# Patient Record
Sex: Male | Born: 1995 | Race: White | Hispanic: No | Marital: Single | Smoking: Never smoker
Health system: Southern US, Community
[De-identification: ages and names within clinical notes are randomized; demographics above are authoritative.]

---

## 2014-12-22 ENCOUNTER — Inpatient Hospital Stay (HOSPITAL_COMMUNITY): Payer: BLUE CROSS/BLUE SHIELD

## 2014-12-22 ENCOUNTER — Emergency Department (HOSPITAL_COMMUNITY): Payer: BLUE CROSS/BLUE SHIELD | Admitting: Anesthesiology

## 2014-12-22 ENCOUNTER — Emergency Department (HOSPITAL_COMMUNITY): Payer: BLUE CROSS/BLUE SHIELD

## 2014-12-22 ENCOUNTER — Inpatient Hospital Stay (HOSPITAL_COMMUNITY)
Admission: EM | Admit: 2014-12-22 | Discharge: 2014-12-31 | DRG: 956 | Disposition: A | Discharge status: Rehabilitation Facility | Payer: BLUE CROSS/BLUE SHIELD | Attending: General Surgery | Admitting: General Surgery

## 2014-12-22 ENCOUNTER — Encounter (HOSPITAL_COMMUNITY): Admission: EM | Disposition: A | Discharge status: Rehabilitation Facility | Payer: Self-pay | Source: Home / Self Care

## 2014-12-22 DIAGNOSIS — R402112 Coma scale, eyes open, never, at arrival to emergency department: Secondary | ICD-10-CM | POA: Diagnosis present

## 2014-12-22 DIAGNOSIS — S32049A Unspecified fracture of fourth lumbar vertebra, initial encounter for closed fracture: Secondary | ICD-10-CM | POA: Diagnosis present

## 2014-12-22 DIAGNOSIS — K567 Ileus, unspecified: Secondary | ICD-10-CM | POA: Diagnosis not present

## 2014-12-22 DIAGNOSIS — F09 Unspecified mental disorder due to known physiological condition: Secondary | ICD-10-CM | POA: Diagnosis not present

## 2014-12-22 DIAGNOSIS — S6292XA Unspecified fracture of left wrist and hand, initial encounter for closed fracture: Secondary | ICD-10-CM | POA: Diagnosis present

## 2014-12-22 DIAGNOSIS — R310 Gross hematuria: Secondary | ICD-10-CM | POA: Diagnosis not present

## 2014-12-22 DIAGNOSIS — S7291XA Unspecified fracture of right femur, initial encounter for closed fracture: Secondary | ICD-10-CM | POA: Diagnosis present

## 2014-12-22 DIAGNOSIS — R402362 Coma scale, best motor response, obeys commands, at arrival to emergency department: Secondary | ICD-10-CM | POA: Diagnosis present

## 2014-12-22 DIAGNOSIS — S32039A Unspecified fracture of third lumbar vertebra, initial encounter for closed fracture: Secondary | ICD-10-CM | POA: Diagnosis present

## 2014-12-22 DIAGNOSIS — S27322A Contusion of lung, bilateral, initial encounter: Secondary | ICD-10-CM | POA: Diagnosis present

## 2014-12-22 DIAGNOSIS — S72351A Displaced comminuted fracture of shaft of right femur, initial encounter for closed fracture: Principal | ICD-10-CM | POA: Diagnosis present

## 2014-12-22 DIAGNOSIS — S069X4S Unspecified intracranial injury with loss of consciousness of 6 hours to 24 hours, sequela: Secondary | ICD-10-CM | POA: Diagnosis not present

## 2014-12-22 DIAGNOSIS — D696 Thrombocytopenia, unspecified: Secondary | ICD-10-CM | POA: Diagnosis not present

## 2014-12-22 DIAGNOSIS — E871 Hypo-osmolality and hyponatremia: Secondary | ICD-10-CM | POA: Diagnosis not present

## 2014-12-22 DIAGNOSIS — S37091S Other injury of right kidney, sequela: Secondary | ICD-10-CM | POA: Diagnosis not present

## 2014-12-22 DIAGNOSIS — I959 Hypotension, unspecified: Secondary | ICD-10-CM | POA: Diagnosis present

## 2014-12-22 DIAGNOSIS — S37009A Unspecified injury of unspecified kidney, initial encounter: Secondary | ICD-10-CM | POA: Diagnosis present

## 2014-12-22 DIAGNOSIS — T1490XA Injury, unspecified, initial encounter: Secondary | ICD-10-CM

## 2014-12-22 DIAGNOSIS — D62 Acute posthemorrhagic anemia: Secondary | ICD-10-CM | POA: Diagnosis not present

## 2014-12-22 DIAGNOSIS — S37001S Unspecified injury of right kidney, sequela: Secondary | ICD-10-CM | POA: Diagnosis not present

## 2014-12-22 DIAGNOSIS — S2239XA Fracture of one rib, unspecified side, initial encounter for closed fracture: Secondary | ICD-10-CM | POA: Diagnosis present

## 2014-12-22 DIAGNOSIS — Y92410 Unspecified street and highway as the place of occurrence of the external cause: Secondary | ICD-10-CM

## 2014-12-22 DIAGNOSIS — Z419 Encounter for procedure for purposes other than remedying health state, unspecified: Secondary | ICD-10-CM

## 2014-12-22 DIAGNOSIS — S62309A Unspecified fracture of unspecified metacarpal bone, initial encounter for closed fracture: Secondary | ICD-10-CM

## 2014-12-22 DIAGNOSIS — S6292XS Unspecified fracture of left wrist and hand, sequela: Secondary | ICD-10-CM | POA: Diagnosis not present

## 2014-12-22 DIAGNOSIS — S32019A Unspecified fracture of first lumbar vertebra, initial encounter for closed fracture: Secondary | ICD-10-CM | POA: Diagnosis present

## 2014-12-22 DIAGNOSIS — S069X1S Unspecified intracranial injury with loss of consciousness of 30 minutes or less, sequela: Secondary | ICD-10-CM | POA: Diagnosis not present

## 2014-12-22 DIAGNOSIS — S62391A Other fracture of second metacarpal bone, left hand, initial encounter for closed fracture: Secondary | ICD-10-CM | POA: Diagnosis present

## 2014-12-22 DIAGNOSIS — S35411A Laceration of right renal artery, initial encounter: Secondary | ICD-10-CM

## 2014-12-22 DIAGNOSIS — S62393A Other fracture of third metacarpal bone, left hand, initial encounter for closed fracture: Secondary | ICD-10-CM | POA: Diagnosis present

## 2014-12-22 DIAGNOSIS — M79661 Pain in right lower leg: Secondary | ICD-10-CM | POA: Diagnosis present

## 2014-12-22 DIAGNOSIS — S37031A Laceration of right kidney, unspecified degree, initial encounter: Secondary | ICD-10-CM | POA: Diagnosis present

## 2014-12-22 DIAGNOSIS — S42101S Fracture of unspecified part of scapula, right shoulder, sequela: Secondary | ICD-10-CM | POA: Diagnosis not present

## 2014-12-22 DIAGNOSIS — S36113A Laceration of liver, unspecified degree, initial encounter: Secondary | ICD-10-CM | POA: Diagnosis present

## 2014-12-22 DIAGNOSIS — R413 Other amnesia: Secondary | ICD-10-CM | POA: Diagnosis present

## 2014-12-22 DIAGNOSIS — R0902 Hypoxemia: Secondary | ICD-10-CM | POA: Diagnosis not present

## 2014-12-22 DIAGNOSIS — S37031D Laceration of right kidney, unspecified degree, subsequent encounter: Secondary | ICD-10-CM

## 2014-12-22 DIAGNOSIS — S27329A Contusion of lung, unspecified, initial encounter: Secondary | ICD-10-CM

## 2014-12-22 DIAGNOSIS — H547 Unspecified visual loss: Secondary | ICD-10-CM

## 2014-12-22 DIAGNOSIS — R402242 Coma scale, best verbal response, confused conversation, at arrival to emergency department: Secondary | ICD-10-CM | POA: Diagnosis present

## 2014-12-22 DIAGNOSIS — S37009S Unspecified injury of unspecified kidney, sequela: Secondary | ICD-10-CM

## 2014-12-22 DIAGNOSIS — S81012A Laceration without foreign body, left knee, initial encounter: Secondary | ICD-10-CM | POA: Diagnosis present

## 2014-12-22 DIAGNOSIS — S37001A Unspecified injury of right kidney, initial encounter: Secondary | ICD-10-CM | POA: Diagnosis present

## 2014-12-22 DIAGNOSIS — S060X0A Concussion without loss of consciousness, initial encounter: Secondary | ICD-10-CM | POA: Diagnosis present

## 2014-12-22 DIAGNOSIS — S37031S Laceration of right kidney, unspecified degree, sequela: Secondary | ICD-10-CM

## 2014-12-22 DIAGNOSIS — S069X3S Unspecified intracranial injury with loss of consciousness of 1 hour to 5 hours 59 minutes, sequela: Secondary | ICD-10-CM | POA: Diagnosis not present

## 2014-12-22 DIAGNOSIS — S42101A Fracture of unspecified part of scapula, right shoulder, initial encounter for closed fracture: Secondary | ICD-10-CM | POA: Diagnosis present

## 2014-12-22 DIAGNOSIS — S32029A Unspecified fracture of second lumbar vertebra, initial encounter for closed fracture: Secondary | ICD-10-CM | POA: Diagnosis present

## 2014-12-22 DIAGNOSIS — S069X1D Unspecified intracranial injury with loss of consciousness of 30 minutes or less, subsequent encounter: Secondary | ICD-10-CM | POA: Diagnosis not present

## 2014-12-22 DIAGNOSIS — S3690XS Unspecified injury of unspecified intra-abdominal organ, sequela: Secondary | ICD-10-CM | POA: Diagnosis not present

## 2014-12-22 DIAGNOSIS — S42111A Displaced fracture of body of scapula, right shoulder, initial encounter for closed fracture: Secondary | ICD-10-CM | POA: Diagnosis present

## 2014-12-22 DIAGNOSIS — S06893S Other specified intracranial injury with loss of consciousness of 1 hour to 5 hours 59 minutes, sequela: Secondary | ICD-10-CM | POA: Diagnosis not present

## 2014-12-22 DIAGNOSIS — S069X9S Unspecified intracranial injury with loss of consciousness of unspecified duration, sequela: Secondary | ICD-10-CM | POA: Diagnosis not present

## 2014-12-22 DIAGNOSIS — S7291XS Unspecified fracture of right femur, sequela: Secondary | ICD-10-CM | POA: Diagnosis not present

## 2014-12-22 HISTORY — PX: FEMUR IM NAIL: SHX1597

## 2014-12-22 HISTORY — PX: I & D EXTREMITY: SHX5045

## 2014-12-22 LAB — POCT I-STAT 4, (NA,K, GLUC, HGB,HCT)
GLUCOSE: 225 mg/dL — AB (ref 65–99)
Glucose, Bld: 249 mg/dL — ABNORMAL HIGH (ref 65–99)
HCT: 27 % — ABNORMAL LOW (ref 39.0–52.0)
HEMATOCRIT: 30 % — AB (ref 39.0–52.0)
Hemoglobin: 10.2 g/dL — ABNORMAL LOW (ref 13.0–17.0)
Hemoglobin: 9.2 g/dL — ABNORMAL LOW (ref 13.0–17.0)
Potassium: 5.4 mmol/L — ABNORMAL HIGH (ref 3.5–5.1)
Potassium: 5.1 mmol/L (ref 3.5–5.1)
Sodium: 137 mmol/L (ref 135–145)
Sodium: 138 mmol/L (ref 135–145)

## 2014-12-22 LAB — SAMPLE TO BLOOD BANK

## 2014-12-22 LAB — CBC
HCT: 45 % (ref 39.0–52.0)
HCT: 28.9 % — ABNORMAL LOW (ref 39.0–52.0)
HEMOGLOBIN: 10 g/dL — AB (ref 13.0–17.0)
Hemoglobin: 15.1 g/dL (ref 13.0–17.0)
MCH: 31.5 pg (ref 26.0–34.0)
MCH: 31.3 pg (ref 26.0–34.0)
MCHC: 33.6 g/dL (ref 30.0–36.0)
MCHC: 34.6 g/dL (ref 30.0–36.0)
MCV: 93.9 fL (ref 78.0–100.0)
MCV: 90.3 fL (ref 78.0–100.0)
PLATELETS: 265 10*3/uL (ref 150–400)
PLATELETS: 104 10*3/uL — AB (ref 150–400)
RBC: 4.79 MIL/uL (ref 4.22–5.81)
RBC: 3.2 MIL/uL — AB (ref 4.22–5.81)
RDW: 12.9 % (ref 11.5–15.5)
RDW: 13.7 % (ref 11.5–15.5)
WBC: 7.4 10*3/uL (ref 4.0–10.5)
WBC: 9.2 10*3/uL (ref 4.0–10.5)

## 2014-12-22 LAB — CBC WITH DIFFERENTIAL/PLATELET
Basophils Absolute: 0 10*3/uL (ref 0.0–0.1)
Basophils Relative: 0 % (ref 0–1)
EOS ABS: 0 10*3/uL (ref 0.0–0.7)
EOS PCT: 0 % (ref 0–5)
HCT: 36.7 % — ABNORMAL LOW (ref 39.0–52.0)
HEMOGLOBIN: 12.3 g/dL — AB (ref 13.0–17.0)
LYMPHS ABS: 1.6 10*3/uL (ref 0.7–4.0)
LYMPHS PCT: 9 % — AB (ref 12–46)
MCH: 31.7 pg (ref 26.0–34.0)
MCHC: 33.5 g/dL (ref 30.0–36.0)
MCV: 94.6 fL (ref 78.0–100.0)
MONOS PCT: 11 % (ref 3–12)
Monocytes Absolute: 2 10*3/uL — ABNORMAL HIGH (ref 0.1–1.0)
Neutro Abs: 14.4 10*3/uL — ABNORMAL HIGH (ref 1.7–7.7)
Neutrophils Relative %: 80 % — ABNORMAL HIGH (ref 43–77)
PLATELETS: 210 10*3/uL (ref 150–400)
RBC: 3.88 MIL/uL — ABNORMAL LOW (ref 4.22–5.81)
RDW: 12.9 % (ref 11.5–15.5)
WBC: 18 10*3/uL — ABNORMAL HIGH (ref 4.0–10.5)

## 2014-12-22 LAB — POCT I-STAT 7, (LYTES, BLD GAS, ICA,H+H)
ACID-BASE DEFICIT: 5 mmol/L — AB (ref 0.0–2.0)
Acid-base deficit: 2 mmol/L (ref 0.0–2.0)
BICARBONATE: 23.6 meq/L (ref 20.0–24.0)
BICARBONATE: 21.3 meq/L (ref 20.0–24.0)
CALCIUM ION: 0.88 mmol/L — AB (ref 1.12–1.23)
Calcium, Ion: 1.1 mmol/L — ABNORMAL LOW (ref 1.12–1.23)
HEMATOCRIT: 25 % — AB (ref 39.0–52.0)
HEMATOCRIT: 30 % — AB (ref 39.0–52.0)
HEMOGLOBIN: 8.5 g/dL — AB (ref 13.0–17.0)
Hemoglobin: 10.2 g/dL — ABNORMAL LOW (ref 13.0–17.0)
O2 SAT: 100 %
O2 Saturation: 100 %
PCO2 ART: 43 mmHg (ref 35.0–45.0)
PH ART: 7.349 — AB (ref 7.350–7.450)
PH ART: 7.308 — AB (ref 7.350–7.450)
POTASSIUM: 4.5 mmol/L (ref 3.5–5.1)
POTASSIUM: 5.5 mmol/L — AB (ref 3.5–5.1)
Patient temperature: 37.2
Patient temperature: 36.4
SODIUM: 138 mmol/L (ref 135–145)
SODIUM: 137 mmol/L (ref 135–145)
TCO2: 25 mmol/L (ref 0–100)
TCO2: 23 mmol/L (ref 0–100)
pCO2 arterial: 42.2 mmHg (ref 35.0–45.0)
pO2, Arterial: 329 mmHg — ABNORMAL HIGH (ref 80.0–100.0)
pO2, Arterial: 272 mmHg — ABNORMAL HIGH (ref 80.0–100.0)

## 2014-12-22 LAB — COMPREHENSIVE METABOLIC PANEL
ALK PHOS: 71 U/L (ref 38–126)
ALT: 163 U/L — AB (ref 17–63)
AST: 201 U/L — ABNORMAL HIGH (ref 15–41)
Albumin: 4.1 g/dL (ref 3.5–5.0)
Anion gap: 12 (ref 5–15)
BUN: 9 mg/dL (ref 6–20)
CALCIUM: 8.8 mg/dL — AB (ref 8.9–10.3)
CO2: 24 mmol/L (ref 22–32)
CREATININE: 1.23 mg/dL (ref 0.61–1.24)
Chloride: 103 mmol/L (ref 101–111)
Glucose, Bld: 130 mg/dL — ABNORMAL HIGH (ref 65–99)
Potassium: 3.2 mmol/L — ABNORMAL LOW (ref 3.5–5.1)
Sodium: 139 mmol/L (ref 135–145)
Total Bilirubin: 1.3 mg/dL — ABNORMAL HIGH (ref 0.3–1.2)
Total Protein: 7 g/dL (ref 6.5–8.1)

## 2014-12-22 LAB — BLOOD GAS, ARTERIAL
ACID-BASE DEFICIT: 5 mmol/L — AB (ref 0.0–2.0)
Bicarbonate: 19.3 mEq/L — ABNORMAL LOW (ref 20.0–24.0)
DRAWN BY: 270271
FIO2: 0.7
O2 SAT: 99.8 %
PCO2 ART: 34 mmHg — AB (ref 35.0–45.0)
PEEP: 5 cmH2O
PH ART: 7.373 (ref 7.350–7.450)
Patient temperature: 98.6
RATE: 18 resp/min
TCO2: 20.3 mmol/L (ref 0–100)
VT: 600 mL
pO2, Arterial: 315 mmHg — ABNORMAL HIGH (ref 80.0–100.0)

## 2014-12-22 LAB — ETHANOL

## 2014-12-22 LAB — ABO/RH: ABO/RH(D): A POS

## 2014-12-22 LAB — PROTIME-INR
INR: 1.23 (ref 0.00–1.49)
Prothrombin Time: 15.6 seconds — ABNORMAL HIGH (ref 11.6–15.2)

## 2014-12-22 LAB — MRSA PCR SCREENING: MRSA BY PCR: NEGATIVE

## 2014-12-22 LAB — CDS SEROLOGY

## 2014-12-22 LAB — PREPARE RBC (CROSSMATCH)

## 2014-12-22 SURGERY — INSERTION, INTRAMEDULLARY ROD, FEMUR
Anesthesia: General | Site: Leg Upper | Laterality: Right

## 2014-12-22 MED ORDER — TETANUS-DIPHTH-ACELL PERTUSSIS 5-2.5-18.5 LF-MCG/0.5 IM SUSP
0.5000 mL | Freq: Once | INTRAMUSCULAR | Status: AC
  Administered 2014-12-22: 0.5 mL via INTRAMUSCULAR
  Filled 2014-12-22: qty 0.5

## 2014-12-22 MED ORDER — METOCLOPRAMIDE HCL 5 MG/ML IJ SOLN: 5.0000 mg | Freq: Three times a day (TID) | INTRAMUSCULAR | Status: DC | PRN

## 2014-12-22 MED ORDER — PHENYLEPHRINE HCL 10 MG/ML IJ SOLN
10.0000 mg | INTRAVENOUS | Status: DC | PRN
  Administered 2014-12-22: 20 ug/min via INTRAVENOUS

## 2014-12-22 MED ORDER — LACTATED RINGERS IV SOLN
INTRAVENOUS | Status: DC | PRN
  Administered 2014-12-22 (×2): via INTRAVENOUS

## 2014-12-22 MED ORDER — ALBUMIN HUMAN 5 % IV SOLN: INTRAVENOUS | Status: DC | PRN

## 2014-12-22 MED ORDER — ALBUMIN HUMAN 5 % IV SOLN
INTRAVENOUS | Status: DC | PRN
  Administered 2014-12-22 (×2): via INTRAVENOUS

## 2014-12-22 MED ORDER — ONDANSETRON HCL 4 MG PO TABS: 4.0000 mg | ORAL_TABLET | Freq: Four times a day (QID) | ORAL | Status: DC | PRN

## 2014-12-22 MED ORDER — FENTANYL CITRATE (PF) 100 MCG/2ML IJ SOLN
INTRAMUSCULAR | Status: AC
  Filled 2014-12-22: qty 2

## 2014-12-22 MED ORDER — HYDROMORPHONE HCL 1 MG/ML IJ SOLN
0.5000 mg | INTRAMUSCULAR | Status: DC | PRN
  Administered 2014-12-23 – 2014-12-24 (×5): 0.5 mg via INTRAVENOUS
  Filled 2014-12-22 (×5): qty 1

## 2014-12-22 MED ORDER — SODIUM CHLORIDE 0.9 % IV SOLN
INTRAVENOUS | Status: DC | PRN
  Administered 2014-12-22 (×2): via INTRAVENOUS

## 2014-12-22 MED ORDER — ROCURONIUM BROMIDE 50 MG/5ML IV SOLN
INTRAVENOUS | Status: AC
  Filled 2014-12-22: qty 1

## 2014-12-22 MED ORDER — DOCUSATE SODIUM 100 MG PO CAPS
100.0000 mg | ORAL_CAPSULE | Freq: Two times a day (BID) | ORAL | Status: DC
  Administered 2014-12-24 – 2014-12-30 (×13): 100 mg via ORAL
  Filled 2014-12-22 (×18): qty 1

## 2014-12-22 MED ORDER — FENTANYL CITRATE (PF) 250 MCG/5ML IJ SOLN
INTRAMUSCULAR | Status: AC
  Filled 2014-12-22: qty 5

## 2014-12-22 MED ORDER — FENTANYL CITRATE (PF) 100 MCG/2ML IJ SOLN
INTRAMUSCULAR | Status: AC | PRN
  Administered 2014-12-22: 100 ug via INTRAVENOUS

## 2014-12-22 MED ORDER — DEXMEDETOMIDINE HCL IN NACL 200 MCG/50ML IV SOLN
0.4000 ug/kg/h | INTRAVENOUS | Status: DC
  Administered 2014-12-22 – 2014-12-23 (×2): 0.5 ug/kg/h via INTRAVENOUS
  Filled 2014-12-22: qty 50
  Filled 2014-12-22: qty 100

## 2014-12-22 MED ORDER — SUCCINYLCHOLINE CHLORIDE 20 MG/ML IJ SOLN
INTRAMUSCULAR | Status: DC | PRN
  Administered 2014-12-22: 100 mg via INTRAVENOUS

## 2014-12-22 MED ORDER — POTASSIUM CHLORIDE IN NACL 20-0.45 MEQ/L-% IV SOLN
INTRAVENOUS | Status: DC
  Administered 2014-12-22 – 2014-12-25 (×4): via INTRAVENOUS
  Administered 2014-12-25: 1000 mL via INTRAVENOUS
  Administered 2014-12-26: 18:00:00 via INTRAVENOUS
  Administered 2014-12-26: 1000 mL via INTRAVENOUS
  Administered 2014-12-27 – 2014-12-28 (×2): via INTRAVENOUS
  Filled 2014-12-22 (×12): qty 1000

## 2014-12-22 MED ORDER — PHENOL 1.4 % MT LIQD: 1.0000 | OROMUCOSAL | Status: DC | PRN

## 2014-12-22 MED ORDER — SODIUM CHLORIDE 0.9 % IV SOLN
INTRAVENOUS | Status: DC | PRN
  Administered 2014-12-22 (×2): via INTRAVENOUS

## 2014-12-22 MED ORDER — PANTOPRAZOLE SODIUM 40 MG IV SOLR
40.0000 mg | Freq: Every day | INTRAVENOUS | Status: DC
  Administered 2014-12-23 (×2): 40 mg via INTRAVENOUS
  Filled 2014-12-22 (×8): qty 40

## 2014-12-22 MED ORDER — PHENYLEPHRINE HCL 10 MG/ML IJ SOLN
INTRAMUSCULAR | Status: DC | PRN
  Administered 2014-12-22: 40 ug via INTRAVENOUS

## 2014-12-22 MED ORDER — SODIUM CHLORIDE 0.9 % IV SOLN
10.0000 mL/h | Freq: Once | INTRAVENOUS | Status: AC
Start: 2014-12-22 — End: 2014-12-22
  Administered 2014-12-22 (×2): via INTRAVENOUS

## 2014-12-22 MED ORDER — PROPOFOL 10 MG/ML IV BOLUS
INTRAVENOUS | Status: AC
  Filled 2014-12-22: qty 20

## 2014-12-22 MED ORDER — CEFAZOLIN SODIUM-DEXTROSE 2-3 GM-% IV SOLR
2.0000 g | Freq: Four times a day (QID) | INTRAVENOUS | Status: AC
  Administered 2014-12-22 – 2014-12-23 (×2): 2 g via INTRAVENOUS
  Filled 2014-12-22 (×2): qty 50

## 2014-12-22 MED ORDER — PANTOPRAZOLE SODIUM 40 MG PO TBEC
40.0000 mg | DELAYED_RELEASE_TABLET | Freq: Every day | ORAL | Status: DC
  Administered 2014-12-24 – 2014-12-28 (×5): 40 mg via ORAL
  Filled 2014-12-22 (×5): qty 1

## 2014-12-22 MED ORDER — METOCLOPRAMIDE HCL 5 MG PO TABS
5.0000 mg | ORAL_TABLET | Freq: Three times a day (TID) | ORAL | Status: DC | PRN
  Filled 2014-12-22: qty 2

## 2014-12-22 MED ORDER — MIDAZOLAM HCL 5 MG/5ML IJ SOLN
INTRAMUSCULAR | Status: DC | PRN
Start: 2014-12-22 — End: 2014-12-22
  Administered 2014-12-22 (×4): 1 mg via INTRAVENOUS

## 2014-12-22 MED ORDER — FENTANYL CITRATE (PF) 100 MCG/2ML IJ SOLN
25.0000 ug | INTRAMUSCULAR | Status: DC | PRN
  Administered 2014-12-22: 100 ug via INTRAVENOUS
  Administered 2014-12-23 (×2): 50 ug via INTRAVENOUS
  Administered 2014-12-23 (×2): 25 ug via INTRAVENOUS
  Administered 2014-12-23 – 2014-12-24 (×3): 75 ug via INTRAVENOUS
  Filled 2014-12-22 (×8): qty 2

## 2014-12-22 MED ORDER — SODIUM CHLORIDE 0.9 % IR SOLN
Status: DC | PRN
  Administered 2014-12-22: 1

## 2014-12-22 MED ORDER — ACETAMINOPHEN 650 MG RE SUPP: 650.0000 mg | Freq: Four times a day (QID) | RECTAL | Status: DC | PRN

## 2014-12-22 MED ORDER — ONDANSETRON HCL 4 MG/2ML IJ SOLN: 4.0000 mg | Freq: Four times a day (QID) | INTRAMUSCULAR | Status: DC | PRN

## 2014-12-22 MED ORDER — FENTANYL CITRATE (PF) 100 MCG/2ML IJ SOLN
50.0000 ug | Freq: Once | INTRAMUSCULAR | Status: AC
  Administered 2014-12-22: 50 ug via INTRAVENOUS
  Filled 2014-12-22: qty 2

## 2014-12-22 MED ORDER — FENTANYL CITRATE (PF) 100 MCG/2ML IJ SOLN
INTRAMUSCULAR | Status: DC | PRN
  Administered 2014-12-22 (×2): 100 ug via INTRAVENOUS
  Administered 2014-12-22 (×6): 50 ug via INTRAVENOUS

## 2014-12-22 MED ORDER — IOHEXOL 300 MG/ML  SOLN
100.0000 mL | Freq: Once | INTRAMUSCULAR | Status: AC | PRN
  Administered 2014-12-22: 100 mL via INTRAVENOUS

## 2014-12-22 MED ORDER — MIDAZOLAM HCL 2 MG/2ML IJ SOLN
INTRAMUSCULAR | Status: AC
  Filled 2014-12-22: qty 4

## 2014-12-22 MED ORDER — SILVER SULFADIAZINE 1 % EX CREA
TOPICAL_CREAM | Freq: Every day | CUTANEOUS | Status: DC
  Administered 2014-12-24 – 2014-12-30 (×7): via TOPICAL
  Administered 2014-12-31: 1 via TOPICAL
  Filled 2014-12-22 (×2): qty 85

## 2014-12-22 MED ORDER — OXYCODONE HCL 5 MG PO TABS
5.0000 mg | ORAL_TABLET | ORAL | Status: DC | PRN
  Administered 2014-12-24: 10 mg via ORAL
  Administered 2014-12-25 – 2014-12-26 (×6): 15 mg via ORAL
  Administered 2014-12-26 (×2): 10 mg via ORAL
  Administered 2014-12-26 – 2014-12-28 (×4): 15 mg via ORAL
  Administered 2014-12-28: 10 mg via ORAL
  Administered 2014-12-28 – 2014-12-29 (×3): 15 mg via ORAL
  Filled 2014-12-22 (×3): qty 3
  Filled 2014-12-22 (×2): qty 2
  Filled 2014-12-22 (×2): qty 3
  Filled 2014-12-22 (×2): qty 2
  Filled 2014-12-22 (×2): qty 3
  Filled 2014-12-22: qty 1
  Filled 2014-12-22: qty 2
  Filled 2014-12-22 (×6): qty 3

## 2014-12-22 MED ORDER — LIDOCAINE HCL (CARDIAC) 20 MG/ML IV SOLN
INTRAVENOUS | Status: DC | PRN
  Administered 2014-12-22: 60 mg via INTRAVENOUS

## 2014-12-22 MED ORDER — DEXMEDETOMIDINE HCL IN NACL 200 MCG/50ML IV SOLN
INTRAVENOUS | Status: DC | PRN
  Administered 2014-12-22: .5 ug/kg/h via INTRAVENOUS
  Administered 2014-12-22: 0.4 ug/kg/h via INTRAVENOUS

## 2014-12-22 MED ORDER — CEFAZOLIN SODIUM-DEXTROSE 2-3 GM-% IV SOLR
INTRAVENOUS | Status: AC
  Filled 2014-12-22: qty 50

## 2014-12-22 MED ORDER — LIDOCAINE HCL (CARDIAC) 20 MG/ML IV SOLN
INTRAVENOUS | Status: AC
  Filled 2014-12-22: qty 5

## 2014-12-22 MED ORDER — ACETAMINOPHEN 325 MG PO TABS: 650.0000 mg | ORAL_TABLET | Freq: Four times a day (QID) | ORAL | Status: DC | PRN

## 2014-12-22 MED ORDER — CEFAZOLIN SODIUM-DEXTROSE 2-3 GM-% IV SOLR
INTRAVENOUS | Status: DC | PRN
  Administered 2014-12-22: 2 g via INTRAVENOUS

## 2014-12-22 MED ORDER — SODIUM CHLORIDE 0.9 % IV BOLUS (SEPSIS)
500.0000 mL | Freq: Once | INTRAVENOUS | Status: AC
  Administered 2014-12-22: 500 mL via INTRAVENOUS

## 2014-12-22 MED ORDER — MENTHOL 3 MG MT LOZG: 1.0000 | LOZENGE | OROMUCOSAL | Status: DC | PRN

## 2014-12-22 MED ORDER — MORPHINE SULFATE (PF) 2 MG/ML IV SOLN: 0.5000 mg | INTRAVENOUS | Status: DC | PRN

## 2014-12-22 MED ORDER — PROPOFOL 10 MG/ML IV BOLUS
INTRAVENOUS | Status: DC | PRN
  Administered 2014-12-22: 150 mg via INTRAVENOUS
  Administered 2014-12-22: 10 mg via INTRAVENOUS

## 2014-12-22 MED ORDER — ONDANSETRON HCL 4 MG/2ML IJ SOLN
4.0000 mg | Freq: Once | INTRAMUSCULAR | Status: AC
  Administered 2014-12-22: 4 mg via INTRAVENOUS
  Filled 2014-12-22: qty 2

## 2014-12-22 MED ORDER — SODIUM CHLORIDE 0.9 % IV BOLUS (SEPSIS)
1000.0000 mL | Freq: Once | INTRAVENOUS | Status: AC
  Administered 2014-12-22: 1000 mL via INTRAVENOUS

## 2014-12-22 MED ORDER — ROCURONIUM BROMIDE 100 MG/10ML IV SOLN
INTRAVENOUS | Status: DC | PRN
  Administered 2014-12-22: 30 mg via INTRAVENOUS
  Administered 2014-12-22: 20 mg via INTRAVENOUS

## 2014-12-22 MED ORDER — 0.9 % SODIUM CHLORIDE (POUR BTL) OPTIME
TOPICAL | Status: DC | PRN
  Administered 2014-12-22: 1000 mL

## 2014-12-22 SURGICAL SUPPLY — 82 items
BAG DECANTER FOR FLEXI CONT (MISCELLANEOUS) ×4 IMPLANT
BANDAGE ELASTIC 4 VELCRO ST LF (GAUZE/BANDAGES/DRESSINGS) ×4 IMPLANT
BANDAGE ELASTIC 6 VELCRO ST LF (GAUZE/BANDAGES/DRESSINGS) ×4 IMPLANT
BIT DRILL 3.8X6 NS (BIT) ×4 IMPLANT
BIT DRILL 5.3 (BIT) ×4 IMPLANT
BNDG COHESIVE 4X5 TAN STRL (GAUZE/BANDAGES/DRESSINGS) ×4 IMPLANT
BNDG GAUZE ELAST 4 BULKY (GAUZE/BANDAGES/DRESSINGS) ×4 IMPLANT
BRUSH SCRUB DISP (MISCELLANEOUS) ×4 IMPLANT
COVER MAYO STAND STRL (DRAPES) ×8 IMPLANT
COVER PERINEAL POST (MISCELLANEOUS) ×4 IMPLANT
COVER SURGICAL LIGHT HANDLE (MISCELLANEOUS) ×8 IMPLANT
CUFF TOURNIQUET SINGLE 18IN (TOURNIQUET CUFF) IMPLANT
CUFF TOURNIQUET SINGLE 24IN (TOURNIQUET CUFF) IMPLANT
CUFF TOURNIQUET SINGLE 34IN LL (TOURNIQUET CUFF) IMPLANT
DRAPE C-ARM 42X72 X-RAY (DRAPES) IMPLANT
DRAPE IMP U-DRAPE 54X76 (DRAPES) ×4 IMPLANT
DRAPE INCISE IOBAN 66X45 STRL (DRAPES) ×4 IMPLANT
DRAPE ORTHO SPLIT 77X108 STRL (DRAPES)
DRAPE PROXIMA HALF (DRAPES) ×8 IMPLANT
DRAPE STERI IOBAN 125X83 (DRAPES) ×4 IMPLANT
DRAPE SURG ORHT 6 SPLT 77X108 (DRAPES) IMPLANT
DRAPE U-SHAPE 47X51 STRL (DRAPES) ×4 IMPLANT
DRSG ADAPTIC 3X8 NADH LF (GAUZE/BANDAGES/DRESSINGS) IMPLANT
DRSG EMULSION OIL 3X3 NADH (GAUZE/BANDAGES/DRESSINGS) ×4 IMPLANT
DRSG MEPILEX BORDER 4X4 (GAUZE/BANDAGES/DRESSINGS) ×8 IMPLANT
DRSG MEPILEX BORDER 4X8 (GAUZE/BANDAGES/DRESSINGS) ×4 IMPLANT
DRSG OPSITE POSTOP 4X10 (GAUZE/BANDAGES/DRESSINGS) ×12 IMPLANT
DRSG OPSITE POSTOP 4X6 (GAUZE/BANDAGES/DRESSINGS) ×4 IMPLANT
DRSG PAD ABDOMINAL 8X10 ST (GAUZE/BANDAGES/DRESSINGS) ×8 IMPLANT
DURAPREP 26ML APPLICATOR (WOUND CARE) ×4 IMPLANT
ELECT REM PT RETURN 9FT ADLT (ELECTROSURGICAL) ×4
ELECTRODE REM PT RTRN 9FT ADLT (ELECTROSURGICAL) ×2 IMPLANT
FACESHIELD WRAPAROUND (MASK) ×4 IMPLANT
GAUZE SPONGE 4X4 12PLY STRL (GAUZE/BANDAGES/DRESSINGS) ×4 IMPLANT
GLOVE BIOGEL PI ORTHO PRO 7.5 (GLOVE) ×2
GLOVE BIOGEL PI ORTHO PRO SZ8 (GLOVE) ×2
GLOVE ORTHO TXT STRL SZ7.5 (GLOVE) ×4 IMPLANT
GLOVE PI ORTHO PRO STRL 7.5 (GLOVE) ×2 IMPLANT
GLOVE PI ORTHO PRO STRL SZ8 (GLOVE) ×2 IMPLANT
GLOVE SURG ORTHO 8.5 STRL (GLOVE) ×4 IMPLANT
GOWN STRL REUS W/ TWL LRG LVL3 (GOWN DISPOSABLE) ×6 IMPLANT
GOWN STRL REUS W/ TWL XL LVL3 (GOWN DISPOSABLE) ×4 IMPLANT
GOWN STRL REUS W/TWL LRG LVL3 (GOWN DISPOSABLE) ×6
GOWN STRL REUS W/TWL XL LVL3 (GOWN DISPOSABLE) ×4
GUIDEWIRE BALL NOSE 100CM (WIRE) ×8 IMPLANT
HANDPIECE INTERPULSE COAX TIP (DISPOSABLE)
KIT BASIN OR (CUSTOM PROCEDURE TRAY) ×4 IMPLANT
KIT ROOM TURNOVER OR (KITS) ×4 IMPLANT
LINER BOOT UNIVERSAL DISP (MISCELLANEOUS) ×4 IMPLANT
MANIFOLD NEPTUNE II (INSTRUMENTS) ×4 IMPLANT
NAIL TROCH RH 11MMX38CM (Nail) ×4 IMPLANT
NS IRRIG 1000ML POUR BTL (IV SOLUTION) ×4 IMPLANT
PACK GENERAL/GYN (CUSTOM PROCEDURE TRAY) ×4 IMPLANT
PACK ORTHO EXTREMITY (CUSTOM PROCEDURE TRAY) ×4 IMPLANT
PAD ARMBOARD 7.5X6 YLW CONV (MISCELLANEOUS) ×8 IMPLANT
PAD CAST 4YDX4 CTTN HI CHSV (CAST SUPPLIES) ×2 IMPLANT
PADDING CAST COTTON 4X4 STRL (CAST SUPPLIES) ×2
PENCIL BUTTON HOLSTER BLD 10FT (ELECTRODE) IMPLANT
SCREW ACE CORTICAL (Screw) ×2 IMPLANT
SCREW ACECAP 44MM (Screw) ×4 IMPLANT
SCREW ACECAP 48MM (Screw) ×4 IMPLANT
SCREW BN FT 80X6.5XST DRV (Screw) ×2 IMPLANT
SET HNDPC FAN SPRY TIP SCT (DISPOSABLE) IMPLANT
SPONGE LAP 18X18 X RAY DECT (DISPOSABLE) ×4 IMPLANT
SPONGE LAP 4X18 X RAY DECT (DISPOSABLE) ×4 IMPLANT
STAPLER VISISTAT 35W (STAPLE) IMPLANT
STOCKINETTE IMPERVIOUS 9X36 MD (GAUZE/BANDAGES/DRESSINGS) ×4 IMPLANT
SUT ETHILON 2 0 FS 18 (SUTURE) IMPLANT
SUT ETHILON 3 0 PS 1 (SUTURE) ×4 IMPLANT
SUT ETHILON 4 0 PS 2 18 (SUTURE) IMPLANT
SUT VIC AB 0 CTB1 27 (SUTURE) IMPLANT
SUT VIC AB 2-0 CT1 27 (SUTURE)
SUT VIC AB 2-0 CT1 TAPERPNT 27 (SUTURE) IMPLANT
SYR 50ML SLIP (SYRINGE) ×4 IMPLANT
TOWEL OR 17X24 6PK STRL BLUE (TOWEL DISPOSABLE) ×4 IMPLANT
TOWEL OR 17X26 10 PK STRL BLUE (TOWEL DISPOSABLE) ×4 IMPLANT
TUBE ANAEROBIC SPECIMEN COL (MISCELLANEOUS) IMPLANT
TUBE CONNECTING 12'X1/4 (SUCTIONS) ×1
TUBE CONNECTING 12X1/4 (SUCTIONS) ×3 IMPLANT
UNDERPAD 30X30 INCONTINENT (UNDERPADS AND DIAPERS) ×4 IMPLANT
WATER STERILE IRR 1000ML POUR (IV SOLUTION) IMPLANT
YANKAUER SUCT BULB TIP NO VENT (SUCTIONS) ×4 IMPLANT

## 2014-12-22 NOTE — ED Notes (Signed)
Portable scans being performed at bedside.

## 2014-12-22 NOTE — ED Notes (Signed)
Pt taken to OR, report given to [place].

## 2014-12-22 NOTE — Anesthesia Postprocedure Evaluation (Signed)
  Anesthesia Post-op Note  Patient: Jose Bentley  Procedure(s) Performed: Procedure(s): INTRAMEDULLARY (IM) NAIL FEMORAL (Right) IRRIGATION AND DEBRIDEMENT LEFT KNEE (Left)  Patient Location: SICU  Anesthesia Type:General  Level of Consciousness: Patient remains intubated per anesthesia plan  Airway and Oxygen Therapy: Patient remains intubated per anesthesia plan and Patient placed on Ventilator (see vital sign flow sheet for setting)  Post-op Pain: none  Post-op Assessment: Post-op Vital signs reviewed              Post-op Vital Signs: Reviewed  Last Vitals:  Filed Vitals:   12/22/14 1530  BP: 89/46  Pulse: 91  Temp:   Resp:     Complications: No apparent anesthesia complications
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
## 2014-12-22 NOTE — ED Notes (Addendum)
Upon entering room pt BP reading 85/40, rechecked and read the same. MD Wyatt aware, verbally ordered 500 cc NS bolus. Called xray to get portable scans STAT.

## 2014-12-22 NOTE — ED Notes (Signed)
Pt room mate arrived to bedside, Dr. Lindie Spruce contacting urology.

## 2014-12-22 NOTE — Progress Notes (Signed)
eLink Physician-Brief Progress Note Patient Name: Jose Bentley DOB: 10-31-1995 MRN: [id number]   Date of Service  12/22/2014  HPI/Events of Note  Returned from OR on vent. Discussed with Dr Lindie Spruce  eICU Interventions  Vent settings clarified Precedex and PRN fentanyl ordered CXR ordered Anticipate extubation AM 8/25     Intervention Category Major Interventions: Respiratory failure - evaluation and management  Brodrick Curran 12/22/2014, 8:47 PM
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
## 2014-12-22 NOTE — Brief Op Note (Signed)
12/22/2014  7:39 PM  PATIENT:  Jose Bentley  19 y.o. male  PRE-OPERATIVE DIAGNOSIS:  left knee laceration possible open left knee, right displaced femur fracture  POST-OPERATIVE DIAGNOSIS:  left knee laceration deep, right displaced femur fracture  PROCEDURE:  Procedure(s): INTRAMEDULLARY (IM) NAIL FEMORAL (Right) IRRIGATION AND DEBRIDEMENT LEFT KNEE (Left) Primary closure of knee wound  SURGEON:  Surgeon(s) and Role:    * Beverely Low, MD - Primary  PHYSICIAN ASSISTANT:   ASSISTANTS: Thea Gist, PA-C   ANESTHESIA:   general  EBL: 300 cc  BLOOD ADMINISTERED:800 CC PRBC  DRAINS: none   LOCAL MEDICATIONS USED:  NONE  SPECIMEN:  No Specimen  DISPOSITION OF SPECIMEN:  N/A  COUNTS:  YES  TOURNIQUET:  * No tourniquets in log *  DICTATION: .Other Dictation: Dictation Number [id number]  PLAN OF CARE: Admit to inpatient   PATIENT DISPOSITION:  ICU - intubated and critically ill.   Delay start of Pharmacological VTE agent (>24hrs) due to surgical blood loss or risk of bleeding: no

## 2014-12-22 NOTE — Discharge Instructions (Signed)
25% WB on the right LE, WBAT on the left LE,  No use of the right shoulder or arm, elevate the left hand when possible  Keep abrasions treated with a thin layer of Silvadene creme daily and covered  Follow up with Dr Rosemary [facility]  [phone]

## 2014-12-22 NOTE — ED Notes (Signed)
Dr. Lindie Spruce at bedside placing a catheter.
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
## 2014-12-22 NOTE — Anesthesia Preprocedure Evaluation (Signed)
Anesthesia Evaluation  Patient identified by MRN, date of birth, ID band Patient awake    Reviewed: Allergy & Precautions, NPO status , Patient's Chart, lab work & pertinent test results  Airway Mallampati: I  TM Distance: >3 FB Neck ROM: Full    Dental no notable dental hx. (+) Teeth Intact   Pulmonary neg pulmonary ROS,  breath sounds clear to auscultation  Pulmonary exam normal       Cardiovascular negative cardio ROS  Rhythm:Regular Rate:Tachycardia     Neuro/Psych negative neurological ROS  negative psych ROS   GI/Hepatic negative GI ROS, Neg liver ROS,   Endo/Other  negative endocrine ROS  Renal/GU negative Renal ROS     Musculoskeletal negative musculoskeletal ROS (+)   Abdominal   Peds  Hematology negative hematology ROS (+)   Anesthesia Other Findings   Reproductive/Obstetrics negative OB ROS                             Anesthesia Physical Anesthesia Plan  ASA: II  Anesthesia Plan: General   Post-op Pain Management:    Induction: Intravenous  Airway Management Planned: Oral ETT  Additional Equipment: Arterial line  Intra-op Plan:   Post-operative Plan: Extubation in OR  Informed Consent: I have reviewed the patients History and Physical, chart, labs and discussed the procedure including the risks, benefits and alternatives for the proposed anesthesia with the patient or authorized representative who has indicated his/her understanding and acceptance.   Dental advisory given  Plan Discussed with: CRNA  Anesthesia Plan Comments: (2 x PIV)        Anesthesia Quick Evaluation
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
## 2014-12-22 NOTE — Transfer of Care (Signed)
Immediate Anesthesia Transfer of Care Note  Patient: Jose Bentley  Procedure(s) Performed: Procedure(s): INTRAMEDULLARY (IM) NAIL FEMORAL (Right) IRRIGATION AND DEBRIDEMENT LEFT KNEE (Left)  Patient Location: NICU  Anesthesia Type:General  Level of Consciousness: sedated and Patient remains intubated per anesthesia plan  Airway & Oxygen Therapy: Patient remains intubated per anesthesia plan and Patient placed on Ventilator (see vital sign flow sheet for setting)  Post-op Assessment: Report given to RN and Post -op Vital signs reviewed and stable  Post vital signs: Reviewed and stable  Last Vitals:  Filed Vitals:   12/22/14 1530  BP: 89/46  Pulse: 91  Temp:   Resp:     Complications: No apparent anesthesia complications
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
## 2014-12-22 NOTE — Progress Notes (Signed)
   12/22/14 1300  Clinical Encounter Type  Visited With Health care provider  Visit Type ED;Trauma  Referral From Nurse  Consult/Referral To Chaplain  [facility] attempted to contact uncle; no answer.

## 2014-12-23 ENCOUNTER — Inpatient Hospital Stay (HOSPITAL_COMMUNITY): Payer: BLUE CROSS/BLUE SHIELD

## 2014-12-23 ENCOUNTER — Encounter (HOSPITAL_COMMUNITY): Payer: Self-pay | Admitting: *Deleted

## 2014-12-23 LAB — CBC
HCT: 26.7 % — ABNORMAL LOW (ref 39.0–52.0)
HEMATOCRIT: 23.2 % — AB (ref 39.0–52.0)
HEMATOCRIT: 28 % — AB (ref 39.0–52.0)
HEMOGLOBIN: 8.1 g/dL — AB (ref 13.0–17.0)
HEMOGLOBIN: 9.7 g/dL — AB (ref 13.0–17.0)
HEMOGLOBIN: 9.4 g/dL — AB (ref 13.0–17.0)
MCH: 31.4 pg (ref 26.0–34.0)
MCH: 31.1 pg (ref 26.0–34.0)
MCH: 31.3 pg (ref 26.0–34.0)
MCHC: 34.9 g/dL (ref 30.0–36.0)
MCHC: 34.6 g/dL (ref 30.0–36.0)
MCHC: 35.2 g/dL (ref 30.0–36.0)
MCV: 89.9 fL (ref 78.0–100.0)
MCV: 89.7 fL (ref 78.0–100.0)
MCV: 89 fL (ref 78.0–100.0)
PLATELETS: 76 10*3/uL — AB (ref 150–400)
Platelets: 87 10*3/uL — ABNORMAL LOW (ref 150–400)
Platelets: 80 10*3/uL — ABNORMAL LOW (ref 150–400)
RBC: 2.58 MIL/uL — ABNORMAL LOW (ref 4.22–5.81)
RBC: 3.12 MIL/uL — ABNORMAL LOW (ref 4.22–5.81)
RBC: 3 MIL/uL — AB (ref 4.22–5.81)
RDW: 13.7 % (ref 11.5–15.5)
RDW: 14.1 % (ref 11.5–15.5)
RDW: 14.1 % (ref 11.5–15.5)
WBC: 7.4 10*3/uL (ref 4.0–10.5)
WBC: 9 10*3/uL (ref 4.0–10.5)
WBC: 10.2 10*3/uL (ref 4.0–10.5)

## 2014-12-23 LAB — PREPARE FRESH FROZEN PLASMA
Unit division: 0
Unit division: 0

## 2014-12-23 LAB — BASIC METABOLIC PANEL
ANION GAP: 8 (ref 5–15)
BUN: 10 mg/dL (ref 6–20)
CALCIUM: 7.5 mg/dL — AB (ref 8.9–10.3)
CO2: 25 mmol/L (ref 22–32)
Chloride: 108 mmol/L (ref 101–111)
Creatinine, Ser: 1 mg/dL (ref 0.61–1.24)
GFR calc non Af Amer: >60 mL/min (ref >60)
Glucose, Bld: 115 mg/dL — ABNORMAL HIGH (ref 65–99)
Potassium: 3.7 mmol/L (ref 3.5–5.1)
Sodium: 141 mmol/L (ref 135–145)

## 2014-12-23 LAB — URINALYSIS, ROUTINE W REFLEX MICROSCOPIC
BILIRUBIN URINE: NEGATIVE
Glucose, UA: NEGATIVE mg/dL
Ketones, ur: NEGATIVE mg/dL
Nitrite: NEGATIVE
PROTEIN: NEGATIVE mg/dL
Specific Gravity, Urine: 1.013 (ref 1.005–1.030)
UROBILINOGEN UA: 0.2 mg/dL (ref 0.0–1.0)
pH: 7 (ref 5.0–8.0)

## 2014-12-23 LAB — PROTIME-INR
INR: 1.59 — ABNORMAL HIGH (ref 0.00–1.49)
PROTHROMBIN TIME: 19 s — AB (ref 11.6–15.2)

## 2014-12-23 LAB — URINE MICROSCOPIC-ADD ON

## 2014-12-23 LAB — PREPARE RBC (CROSSMATCH)

## 2014-12-23 MED ORDER — IPRATROPIUM-ALBUTEROL 0.5-2.5 (3) MG/3ML IN SOLN
3.0000 mL | Freq: Four times a day (QID) | RESPIRATORY_TRACT | Status: DC | PRN
  Administered 2014-12-23 – 2014-12-27 (×2): 3 mL via RESPIRATORY_TRACT
  Filled 2014-12-23 (×2): qty 3

## 2014-12-23 MED ORDER — SODIUM CHLORIDE 0.9 % IV SOLN
Freq: Once | INTRAVENOUS | Status: AC
  Administered 2014-12-23: 05:00:00 via INTRAVENOUS

## 2014-12-23 MED ORDER — ALBUMIN HUMAN 5 % IV SOLN
INTRAVENOUS | Status: AC
  Filled 2014-12-23: qty 250

## 2014-12-23 MED ORDER — FENTANYL CITRATE (PF) 100 MCG/2ML IJ SOLN
INTRAMUSCULAR | Status: AC | PRN
  Administered 2014-12-23: 25 ug via INTRAVENOUS
  Administered 2014-12-23: 50 ug via INTRAVENOUS
  Administered 2014-12-23: 25 ug via INTRAVENOUS
  Administered 2014-12-23: 50 ug via INTRAVENOUS

## 2014-12-23 MED ORDER — MIDAZOLAM HCL 2 MG/2ML IJ SOLN
INTRAMUSCULAR | Status: AC
  Filled 2014-12-23: qty 8

## 2014-12-23 MED ORDER — FENTANYL CITRATE (PF) 100 MCG/2ML IJ SOLN
INTRAMUSCULAR | Status: AC
  Filled 2014-12-23: qty 6

## 2014-12-23 MED ORDER — MIDAZOLAM HCL 2 MG/2ML IJ SOLN
INTRAMUSCULAR | Status: AC | PRN
  Administered 2014-12-23: 0.5 mg via INTRAVENOUS
  Administered 2014-12-23: 1 mg via INTRAVENOUS
  Administered 2014-12-23 (×2): 0.5 mg via INTRAVENOUS
  Administered 2014-12-23: 1 mg via INTRAVENOUS
  Administered 2014-12-23: 0.5 mg via INTRAVENOUS

## 2014-12-23 MED ORDER — LIDOCAINE HCL 1 % IJ SOLN
INTRAMUSCULAR | Status: AC
  Filled 2014-12-23: qty 20

## 2014-12-23 MED ORDER — SODIUM CHLORIDE 0.9 % IV SOLN
500.0000 mL | Freq: Once | INTRAVENOUS | Status: AC
  Administered 2014-12-23: 500 mL via INTRAVENOUS

## 2014-12-23 MED ORDER — IOHEXOL 300 MG/ML  SOLN
150.0000 mL | Freq: Once | INTRAMUSCULAR | Status: DC | PRN
  Administered 2014-12-23: 140 mL via INTRAVENOUS
  Filled 2014-12-23: qty 150

## 2014-12-23 MED ORDER — ALBUMIN HUMAN 5 % IV SOLN
25.0000 g | Freq: Once | INTRAVENOUS | Status: AC
  Administered 2014-12-23: 25 g via INTRAVENOUS
  Filled 2014-12-23: qty 500

## 2014-12-23 MED ORDER — GELATIN ABSORBABLE 12-7 MM EX MISC
CUTANEOUS | Status: AC
  Filled 2014-12-23: qty 1

## 2014-12-23 MED ORDER — ALBUMIN HUMAN 5 % IV SOLN
INTRAVENOUS | Status: AC
  Administered 2014-12-23: 12.5 g
  Filled 2014-12-23: qty 500

## 2014-12-23 NOTE — Progress Notes (Signed)
Patient ID: Jose Bentley, male   DOB: [birth year], 19 y.o.   MRN: [id number] Partial response to transfusion but PLTs down a bit. BP improved. I D/W Dr. Retta Diones and will proceed with IR angio. I will D/W Dr. Loreta Ave. I updated the family. Violeta Gelinas, MD, MPH, FACS Trauma: [phone] General Surgery: [phone]

## 2014-12-23 NOTE — Sedation Documentation (Signed)
Patient denies pain and is resting comfortably.  
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
## 2014-12-23 NOTE — Progress Notes (Signed)
Patient self-extubated about one hour ago.  BP low now.  Hemoglobin about 10.  Platelets are down.  Will recheck INR.  Will bolus with albumin and NS.  FFP if INR> 1.3.  He is resting and breathing comfortably.  Marta Lamas. Gae Bon, MD, FACS [phone] Trauma Surgeon

## 2014-12-23 NOTE — Progress Notes (Signed)
Patient ID: Jose Bentley, male   DOB: 1996-01-20, 19 y.o.   MRN: [id number] Follow up - Trauma Critical Care  Patient Details:    Jose Bentley is an 19 y.o. male.  Lines/tubes : Airway 7.5 mm (Active)  Secured at (cm) 22 cm 12/23/2014 12:11 AM  Measured From Lips 12/23/2014 12:11 AM  Secured Location Right 12/23/2014 12:11 AM  Secured By Pink Tape 12/23/2014 12:11 AM     Arterial Line 12/22/14 Left Radial (Active)  Site Assessment Clean;Dry;Intact 12/22/2014  9:00 PM  Line Status Pulsatile blood flow 12/22/2014  9:00 PM  Art Line Waveform Appropriate 12/22/2014  9:00 PM  Art Line Interventions Zeroed and calibrated 12/22/2014  9:00 PM  Color/Movement/Sensation Capillary refill less than 3 sec 12/22/2014  9:00 PM  Dressing Type Transparent 12/22/2014  9:00 PM  Dressing Status Clean;Dry;Intact 12/22/2014  9:00 PM     Urethral Catheter Dr. Lindie Spruce Straight-tip 16 Fr. (Active)  Indication for Insertion or Continuance of Catheter Bladder outlet obstruction / other urologic reason 12/22/2014  8:00 PM  Site Assessment Intact 12/22/2014  8:00 PM  Catheter Maintenance Bag below level of bladder 12/22/2014  8:00 PM  Collection Container Standard drainage bag 12/22/2014  8:00 PM  Securement Method Securing device (Describe) 12/22/2014  8:00 PM  Urinary Catheter Interventions Irrigated 12/22/2014  8:00 PM  Input (mL) 80 mL 12/22/2014  8:00 PM  Output (mL) 200 mL 12/23/2014  6:00 AM    Microbiology/Sepsis markers: Results for orders placed or performed during the hospital encounter of 12/22/14  MRSA PCR Screening     Status: None   Collection Time: 12/22/14  8:30 PM  Result Value Ref Range Status   MRSA by PCR NEGATIVE NEGATIVE Final    Comment:        The GeneXpert MRSA Assay (FDA approved for NASAL specimens only), is one component of a comprehensive MRSA colonization surveillance program. It is not intended to diagnose MRSA infection nor to guide or monitor treatment for MRSA infections.      Anti-infectives:  Anti-infectives    Start     Dose/Rate Route Frequency Ordered Stop   12/22/14 2230  ceFAZolin (ANCEF) IVPB 2 g/50 mL premix     2 g 100 mL/hr over 30 Minutes Intravenous Every 6 hours 12/22/14 2011 12/23/14 0424      Best Practice/Protocols:  VTE Prophylaxis: Mechanical .  Consults: Treatment Team:  Myrene Galas, MD Marcine Matar, MD    Studies:    Events:  Subjective:    Overnight Issues:   Objective:  Vital signs for last 24 hours: Temp:  [97.7 F (36.5 C)-99.7 F (37.6 C)] 99.7 F (37.6 C) (08/25 0645) Pulse Rate:  [57-119] 107 (08/25 0700) Resp:  [10-24] 20 (08/25 0700) BP: (84-120)/(37-70) 97/46 mmHg (08/25 0700) SpO2:  [96 %-100 %] 100 % (08/25 0700) Arterial Line BP: (92-131)/(34-72) 108/40 mmHg (08/25 0700) FiO2 (%):  [70 %] 70 % (08/25 0011) Weight:  [68.04 kg (150 lb)] 68.04 kg (150 lb) (08/24 1311)  Hemodynamic parameters for last 24 hours:    Intake/Output from previous day: 08/24 0701 - 08/25 0700 In: 9577.2 [I.V.:7001.2; WUJWJ:1914; IV Piggyback:600] Out: 2320 [Urine:1820; Blood:500]  Intake/Output this shift:    Vent settings for last 24 hours: Vent Mode:  [-] PRVC FiO2 (%):  [70 %] 70 % Set Rate:  [18 bmp] 18 bmp Vt Set:  [600 mL] 600 mL PEEP:  [5 cmH20] 5 cmH20 Plateau Pressure:  [17 cmH20-18 cmH20] 17 cmH20  Physical Exam:  General: no distress Neuro: alert and F/C, amnestic to event HEENT/Neck: PERRL Resp: clear to auscultation bilaterally CVS: RRR 110  GI: some distention, sig tender on R side, quiet Extremities: edema contuison L hand, ortho dressings R thigh  Results for orders placed or performed during the hospital encounter of 12/22/14 (from the past 24 hour(s))  CDS serology     Status: None   Collection Time: 12/22/14  1:10 PM  Result Value Ref Range   CDS serology specimen      SPECIMEN WILL BE HELD FOR 14 DAYS IF TESTING IS REQUIRED  Comprehensive metabolic panel     Status: Abnormal    Collection Time: 12/22/14  1:10 PM  Result Value Ref Range   Sodium 139 135 - 145 mmol/L   Potassium 3.2 (L) 3.5 - 5.1 mmol/L   Chloride 103 101 - 111 mmol/L   CO2 24 22 - 32 mmol/L   Glucose, Bld 130 (H) 65 - 99 mg/dL   BUN 9 6 - 20 mg/dL   Creatinine, Ser 1.61 0.61 - 1.24 mg/dL   Calcium 8.8 (L) 8.9 - 10.3 mg/dL   Total Protein 7.0 6.5 - 8.1 g/dL   Albumin 4.1 3.5 - 5.0 g/dL   AST 096 (H) 15 - 41 U/L   ALT 163 (H) 17 - 63 U/L   Alkaline Phosphatase 71 38 - 126 U/L   Total Bilirubin 1.3 (H) 0.3 - 1.2 mg/dL   GFR calc non Af Amer >60 >60 mL/min   GFR calc Af Amer >60 >60 mL/min   Anion gap 12 5 - 15  CBC     Status: None   Collection Time: 12/22/14  1:10 PM  Result Value Ref Range   WBC 7.4 4.0 - 10.5 K/uL   RBC 4.79 4.22 - 5.81 MIL/uL   Hemoglobin 15.1 13.0 - 17.0 g/dL   HCT 04.5 40.9 - 81.1 %   MCV 93.9 78.0 - 100.0 fL   MCH 31.5 26.0 - 34.0 pg   MCHC 33.6 30.0 - 36.0 g/dL   RDW 91.4 78.2 - 95.6 %   Platelets 265 150 - 400 K/uL  Ethanol     Status: None   Collection Time: 12/22/14  1:10 PM  Result Value Ref Range   Alcohol, Ethyl (B) <5 <5 mg/dL  Protime-INR     Status: Abnormal   Collection Time: 12/22/14  1:10 PM  Result Value Ref Range   Prothrombin Time 15.6 (H) 11.6 - 15.2 seconds   INR 1.23 0.00 - 1.49  Sample to Blood Bank     Status: None   Collection Time: 12/22/14  1:10 PM  Result Value Ref Range   Blood Bank Specimen SAMPLE AVAILABLE FOR TESTING    Sample Expiration 12/23/2014   Type and screen     Status: None (Preliminary result)   Collection Time: 12/22/14  1:10 PM  Result Value Ref Range   ABO/RH(D) A POS    Antibody Screen NEG    Sample Expiration 12/25/2014    Unit Number [id number]    Blood Component Type RED CELLS,LR    Unit division 00    Status of Unit ISSUED    Transfusion Status OK TO TRANSFUSE    Crossmatch Result Compatible    Unit Number [id number]    Blood Component Type RED CELLS,LR    Unit division 00    Status of  Unit ISSUED    Transfusion Status OK TO TRANSFUSE    Crossmatch  Result Compatible    Unit Number [id number]    Blood Component Type RED CELLS,LR    Unit division 00    Status of Unit ISSUED    Transfusion Status OK TO TRANSFUSE    Crossmatch Result Compatible    Unit Number [id number]    Blood Component Type RED CELLS,LR    Unit division 00    Status of Unit ISSUED    Transfusion Status OK TO TRANSFUSE    Crossmatch Result Compatible    Unit Number [id number]    Blood Component Type RBC CPDA1, LR    Unit division 00    Status of Unit ALLOCATED    Transfusion Status OK TO TRANSFUSE    Crossmatch Result Compatible   CBC with Differential     Status: Abnormal   Collection Time: 12/22/14  3:48 PM  Result Value Ref Range   WBC 18.0 (H) 4.0 - 10.5 K/uL   RBC 3.88 (L) 4.22 - 5.81 MIL/uL   Hemoglobin 12.3 (L) 13.0 - 17.0 g/dL   HCT 52.8 (L) 41.3 - 24.4 %   MCV 94.6 78.0 - 100.0 fL   MCH 31.7 26.0 - 34.0 pg   MCHC 33.5 30.0 - 36.0 g/dL   RDW 01.0 27.2 - 53.6 %   Platelets 210 150 - 400 K/uL   Neutrophils Relative % 80 (H) 43 - 77 %   Neutro Abs 14.4 (H) 1.7 - 7.7 K/uL   Lymphocytes Relative 9 (L) 12 - 46 %   Lymphs Abs 1.6 0.7 - 4.0 K/uL   Monocytes Relative 11 3 - 12 %   Monocytes Absolute 2.0 (H) 0.1 - 1.0 K/uL   Eosinophils Relative 0 0 - 5 %   Eosinophils Absolute 0.0 0.0 - 0.7 K/uL   Basophils Relative 0 0 - 1 %   Basophils Absolute 0.0 0.0 - 0.1 K/uL  ABO/Rh     Status: None   Collection Time: 12/22/14  4:18 PM  Result Value Ref Range   ABO/RH(D) A POS   I-STAT 7, (LYTES, BLD GAS, ICA, H+H)     Status: Abnormal   Collection Time: 12/22/14  5:16 PM  Result Value Ref Range   pH, Arterial 7.349 (L) 7.350 - 7.450   pCO2 arterial 43.0 35.0 - 45.0 mmHg   pO2, Arterial 329.0 (H) 80.0 - 100.0 mmHg   Bicarbonate 23.6 20.0 - 24.0 mEq/L   TCO2 25 0 - 100 mmol/L   O2 Saturation 100.0 %   Acid-base deficit 2.0 0.0 - 2.0 mmol/L   Sodium 138 135 - 145 mmol/L    Potassium 4.5 3.5 - 5.1 mmol/L   Calcium, Ion 1.10 (L) 1.12 - 1.23 mmol/L   HCT 25.0 (L) 39.0 - 52.0 %   Hemoglobin 8.5 (L) 13.0 - 17.0 g/dL   Patient temperature 64.4 C    Collection site RADIAL, ALLEN'S TEST ACCEPTABLE    Sample type ARTERIAL   Prepare fresh frozen plasma     Status: None (Preliminary result)   Collection Time: 12/22/14  5:46 PM  Result Value Ref Range   Unit Number [id number]    Blood Component Type THAWED PLASMA    Unit division 00    Status of Unit ISSUED    Transfusion Status OK TO TRANSFUSE    Unit Number [id number]    Blood Component Type THWPLS APHR2    Unit division 00    Status of Unit ISSUED    Transfusion Status OK TO TRANSFUSE   Prepare RBC  Status: None   Collection Time: 12/22/14  5:54 PM  Result Value Ref Range   Order Confirmation ORDER PROCESSED BY BLOOD BANK   I-STAT 4, (NA,K, GLUC, HGB,HCT)     Status: Abnormal   Collection Time: 12/22/14  6:19 PM  Result Value Ref Range   Sodium 137 135 - 145 mmol/L   Potassium 5.4 (H) 3.5 - 5.1 mmol/L   Glucose, Bld 249 (H) 65 - 99 mg/dL   HCT 62.1 (L) 30.8 - 65.7 %   Hemoglobin 10.2 (L) 13.0 - 17.0 g/dL  I-STAT 7, (LYTES, BLD GAS, ICA, H+H)     Status: Abnormal   Collection Time: 12/22/14  6:27 PM  Result Value Ref Range   pH, Arterial 7.308 (L) 7.350 - 7.450   pCO2 arterial 42.2 35.0 - 45.0 mmHg   pO2, Arterial 272.0 (H) 80.0 - 100.0 mmHg   Bicarbonate 21.3 20.0 - 24.0 mEq/L   TCO2 23 0 - 100 mmol/L   O2 Saturation 100.0 %   Acid-base deficit 5.0 (H) 0.0 - 2.0 mmol/L   Sodium 137 135 - 145 mmol/L   Potassium 5.5 (H) 3.5 - 5.1 mmol/L   Calcium, Ion 0.88 (L) 1.12 - 1.23 mmol/L   HCT 30.0 (L) 39.0 - 52.0 %   Hemoglobin 10.2 (L) 13.0 - 17.0 g/dL   Patient temperature 84.6 C    Sample type ARTERIAL   I-STAT 4, (NA,K, GLUC, HGB,HCT)     Status: Abnormal   Collection Time: 12/22/14  7:15 PM  Result Value Ref Range   Sodium 138 135 - 145 mmol/L   Potassium 5.1 3.5 - 5.1 mmol/L   Glucose,  Bld 225 (H) 65 - 99 mg/dL   HCT 96.2 (L) 95.2 - 84.1 %   Hemoglobin 9.2 (L) 13.0 - 17.0 g/dL  MRSA PCR Screening     Status: None   Collection Time: 12/22/14  8:30 PM  Result Value Ref Range   MRSA by PCR NEGATIVE NEGATIVE  Blood gas, arterial     Status: Abnormal   Collection Time: 12/22/14  9:15 PM  Result Value Ref Range   FIO2 0.70    Delivery systems VENTILATOR    Mode PRESSURE REGULATED VOLUME CONTROL    VT 600 mL   LHR 18 resp/min   Peep/cpap 5.0 cm H20   pH, Arterial 7.373 7.350 - 7.450   pCO2 arterial 34.0 (L) 35.0 - 45.0 mmHg   pO2, Arterial 315 (H) 80.0 - 100.0 mmHg   Bicarbonate 19.3 (L) 20.0 - 24.0 mEq/L   TCO2 20.3 0 - 100 mmol/L   Acid-base deficit 5.0 (H) 0.0 - 2.0 mmol/L   O2 Saturation 99.8 %   Patient temperature 98.6    Collection site A-LINE    Drawn by [id number]    Sample type ARTERIAL   CBC     Status: Abnormal   Collection Time: 12/22/14 10:30 PM  Result Value Ref Range   WBC 9.2 4.0 - 10.5 K/uL   RBC 3.20 (L) 4.22 - 5.81 MIL/uL   Hemoglobin 10.0 (L) 13.0 - 17.0 g/dL   HCT 02.7 (L) 25.3 - 66.4 %   MCV 90.3 78.0 - 100.0 fL   MCH 31.3 26.0 - 34.0 pg   MCHC 34.6 30.0 - 36.0 g/dL   RDW 40.3 47.4 - 25.9 %   Platelets 104 (L) 150 - 400 K/uL  Urinalysis, Routine w reflex microscopic (not at [facility])     Status: Abnormal   Collection Time: 12/23/14  1:20 AM  Result Value Ref Range   Color, Urine RED (A) YELLOW   APPearance CLOUDY (A) CLEAR   Specific Gravity, Urine 1.013 1.005 - 1.030   pH 7.0 5.0 - 8.0   Glucose, UA NEGATIVE NEGATIVE mg/dL   Hgb urine dipstick LARGE (A) NEGATIVE   Bilirubin Urine NEGATIVE NEGATIVE   Ketones, ur NEGATIVE NEGATIVE mg/dL   Protein, ur NEGATIVE NEGATIVE mg/dL   Urobilinogen, UA 0.2 0.0 - 1.0 mg/dL   Nitrite NEGATIVE NEGATIVE   Leukocytes, UA TRACE (A) NEGATIVE  Urine microscopic-add on     Status: None   Collection Time: 12/23/14  1:20 AM  Result Value Ref Range   WBC, UA 3-6 <3 WBC/hpf   RBC / HPF TOO NUMEROUS TO  COUNT <3 RBC/hpf   Bacteria, UA RARE RARE  CBC     Status: Abnormal   Collection Time: 12/23/14  3:30 AM  Result Value Ref Range   WBC 7.4 4.0 - 10.5 K/uL   RBC 2.58 (L) 4.22 - 5.81 MIL/uL   Hemoglobin 8.1 (L) 13.0 - 17.0 g/dL   HCT 16.1 (L) 09.6 - 04.5 %   MCV 89.9 78.0 - 100.0 fL   MCH 31.4 26.0 - 34.0 pg   MCHC 34.9 30.0 - 36.0 g/dL   RDW 40.9 81.1 - 91.4 %   Platelets 87 (L) 150 - 400 K/uL  Protime-INR     Status: Abnormal   Collection Time: 12/23/14  3:30 AM  Result Value Ref Range   Prothrombin Time 19.0 (H) 11.6 - 15.2 seconds   INR 1.59 (H) 0.00 - 1.49  Prepare fresh frozen plasma     Status: None (Preliminary result)   Collection Time: 12/23/14  4:23 AM  Result Value Ref Range   Unit Number [id number]    Blood Component Type THAWED PLASMA    Unit division 00    Status of Unit ISSUED    Transfusion Status OK TO TRANSFUSE    Unit Number [id number]    Blood Component Type THAWED PLASMA    Unit division 00    Status of Unit ISSUED    Transfusion Status OK TO TRANSFUSE   Prepare RBC     Status: None   Collection Time: 12/23/14  4:24 AM  Result Value Ref Range   Order Confirmation ORDER PROCESSED BY BLOOD BANK     Assessment & Plan: Present on Admission:  . Right kidney injury . Femur fracture   LOS: 1 day   Additional comments:I reviewed the patient's new clinical lab test results. and x-rays [facility] Grade 4 renal lac - discussed with Dr. Retta Diones at the bedside. Has received 4u FFP. Receiving PRBC units 4 and 5 now. Strict bedrest. If Hb does not stabilize will need angioembolization. I will D/W IR team. Continue foley. Grade 2 liver lac with possible ischemia to R lobe - will be on bedrest with serial Hb checks for above. LFTs in AM. R femur FX - S/P IM nail by Dr. Ranell Patrick. R scapula FX - NWB RUE per Dr. Candelaria Stagers 2nd and 3rd [facility] FXs - plan splint once A line out, Dr. Ranell Patrick plans to D/W hand surgery. I D/W him at the bedside. B Pulmonary contusion - IS, pulm  toilet, add BDs PRN. BAL anemia - As above FEN - NPO VTE - PAS Dispo - ICU  Critical Care Total Time*: 30 Minutes  Violeta Gelinas, MD, MPH, FACS Trauma: [phone] General Surgery: [phone]  12/23/2014  *Care during the described  time interval was provided by me. I have reviewed this patient's available data, including medical history, events of note, physical examination and test results as part of my evaluation.
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
## 2014-12-23 NOTE — Progress Notes (Signed)
Pt self extubated himself.  Airway in tach and breathing well on his own talking etc. Pt on 2 lpm nasal cann. E link was notified.

## 2014-12-23 NOTE — Sedation Documentation (Addendum)
Patient denies pain and is resting comfortably.  

## 2014-12-23 NOTE — Progress Notes (Signed)
OT Cancellation Note  Patient Details Name: Trequan Marsolek MRN: [id number] DOB: Aug 19, 1995   Cancelled Treatment:    Reason Eval/Treat Not Completed: Patient not medically ready (Bedrest) Please increase activity orders as appropriate for OT /PT eval  Felecia Shelling   OTR/L Pager: [phone] Office: [phone] .  12/23/2014, 8:14 AM
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
## 2014-12-23 NOTE — Progress Notes (Signed)
Orthopedics Progress Note  Subjective: Patient extubated himself, he reports right thigh pain and right shoulder pain  Objective:  Filed Vitals:   12/23/14 0645  BP: 91/39  Pulse: 107  Temp: 99.7 F (37.6 C)  Resp: 19    General: Awake and alert  Musculoskeletal: left hand swollen but NVI, left knee dressing intact NVI Right shoulder swollen and tender, NVI R UE,  Right LE, no pain with ankle pumps, dressings CDI, compartments supple Neurovascularly intact  Lab Results  Component Value Date   WBC 7.4 12/23/2014   HGB 8.1* 12/23/2014   HCT 23.2* 12/23/2014   MCV 89.9 12/23/2014   PLT 87* 12/23/2014       Component Value Date/Time   NA 138 12/22/2014 1915   K 5.1 12/22/2014 1915   CL 103 12/22/2014 1310   CO2 24 12/22/2014 1310   GLUCOSE 225* 12/22/2014 1915   BUN 9 12/22/2014 1310   CREATININE 1.23 12/22/2014 1310   CALCIUM 8.8* 12/22/2014 1310   GFRNONAA >60 12/22/2014 1310   GFRAA >60 12/22/2014 1310    Lab Results  Component Value Date   INR 1.59* 12/23/2014   INR 1.23 12/22/2014    Assessment/Plan: POD #1 s/p Procedure(s): INTRAMEDULLARY (IM) NAIL FEMORAL IRRIGATION AND DEBRIDEMENT LEFT KNEE Stable from ortho standpoint DVT prophylaxis - mechanical for now CT of the shoulder shows a stable fracture pattern that should allow for non-surgical care.  There will be no therapy for 6 weeks for this injury other than wrist and elbow ROM.  Also unfortunately no weight bearing for the extremity  Almedia Balls. Ranell Patrick, MD 12/23/2014 7:40 AM

## 2014-12-23 NOTE — Sedation Documentation (Signed)
Patient is resting comfortably. 

## 2014-12-23 NOTE — Progress Notes (Signed)
Patient ID: Jose Bentley, male   DOB: 01/16/1996, 19 y.o.   MRN: [id number] I spoke with his uncle, brother and sister at the bedside. Violeta Gelinas, MD, MPH, FACS Trauma: [phone] General Surgery: [phone]

## 2014-12-24 ENCOUNTER — Inpatient Hospital Stay (HOSPITAL_COMMUNITY): Payer: BLUE CROSS/BLUE SHIELD

## 2014-12-24 ENCOUNTER — Encounter (HOSPITAL_COMMUNITY): Payer: Self-pay

## 2014-12-24 LAB — BASIC METABOLIC PANEL
ANION GAP: 5 (ref 5–15)
BUN: 7 mg/dL (ref 6–20)
CALCIUM: 7.8 mg/dL — AB (ref 8.9–10.3)
CHLORIDE: 103 mmol/L (ref 101–111)
CO2: 26 mmol/L (ref 22–32)
Creatinine, Ser: 0.88 mg/dL (ref 0.61–1.24)
GFR calc Af Amer: >60 mL/min (ref >60)
GFR calc non Af Amer: >60 mL/min (ref >60)
GLUCOSE: 114 mg/dL — AB (ref 65–99)
Potassium: 4 mmol/L (ref 3.5–5.1)
Sodium: 134 mmol/L — ABNORMAL LOW (ref 135–145)

## 2014-12-24 LAB — TYPE AND SCREEN
ABO/RH(D): A POS
ANTIBODY SCREEN: NEGATIVE
UNIT DIVISION: 0
UNIT DIVISION: 0
UNIT DIVISION: 0
Unit division: 0
Unit division: 0

## 2014-12-24 LAB — PREPARE FRESH FROZEN PLASMA
UNIT DIVISION: 0
Unit division: 0

## 2014-12-24 LAB — HEPATIC FUNCTION PANEL
ALK PHOS: 45 U/L (ref 38–126)
ALT: 63 U/L (ref 17–63)
AST: 104 U/L — AB (ref 15–41)
Albumin: 3 g/dL — ABNORMAL LOW (ref 3.5–5.0)
BILIRUBIN INDIRECT: 1.1 mg/dL — AB (ref 0.3–0.9)
Bilirubin, Direct: 0.3 mg/dL (ref 0.1–0.5)
TOTAL PROTEIN: 4.9 g/dL — AB (ref 6.5–8.1)
Total Bilirubin: 1.4 mg/dL — ABNORMAL HIGH (ref 0.3–1.2)

## 2014-12-24 LAB — CBC
HEMATOCRIT: 25.7 % — AB (ref 39.0–52.0)
HEMOGLOBIN: 9 g/dL — AB (ref 13.0–17.0)
MCH: 30.9 pg (ref 26.0–34.0)
MCHC: 35 g/dL (ref 30.0–36.0)
MCV: 88.3 fL (ref 78.0–100.0)
Platelets: 89 10*3/uL — ABNORMAL LOW (ref 150–400)
RBC: 2.91 MIL/uL — ABNORMAL LOW (ref 4.22–5.81)
RDW: 14.1 % (ref 11.5–15.5)
WBC: 10.3 10*3/uL (ref 4.0–10.5)

## 2014-12-24 LAB — PROTIME-INR
INR: 1.33 (ref 0.00–1.49)
Prothrombin Time: 16.6 seconds — ABNORMAL HIGH (ref 11.6–15.2)

## 2014-12-24 MED ORDER — IOHEXOL 300 MG/ML  SOLN
80.0000 mL | Freq: Once | INTRAMUSCULAR | Status: AC | PRN
  Administered 2014-12-24: 75 mL via INTRAVENOUS

## 2014-12-24 MED ORDER — HYDROMORPHONE HCL 1 MG/ML IJ SOLN
1.0000 mg | INTRAMUSCULAR | Status: DC | PRN
  Administered 2014-12-24 – 2014-12-26 (×9): 1 mg via INTRAVENOUS
  Administered 2014-12-27: 2 mg via INTRAVENOUS
  Administered 2014-12-27 – 2014-12-28 (×2): 1 mg via INTRAVENOUS
  Administered 2014-12-28: 2 mg via INTRAVENOUS
  Filled 2014-12-24 (×3): qty 1
  Filled 2014-12-24: qty 2
  Filled 2014-12-24: qty 1
  Filled 2014-12-24: qty 2
  Filled 2014-12-24 (×7): qty 1
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
## 2014-12-24 NOTE — Progress Notes (Addendum)
I spoke with Dr. Ova Freshwater  in radiology. We discussed his radiographic findings from CT scan. The patient does have urinary extravasation, but at this point it does not look like he has any collection that needs to be drained-probably just from the fractured parenchyma.  To me, his hematoma looks stable.  I do not think further urologic intervention at this point is necessary.  I would leave his catheter in until he is ambulatory.  However, I would recommend continued bed rest for now due to his renal injury. The patient has other fractures/findings that were seen on CT scan-please see reading.  I would recommend repeat contrasted CT scan in approximately 72 hours, or before then if urinoma suspected.
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
## 2014-12-24 NOTE — Progress Notes (Signed)
Orthopedic Tech Progress Note Patient Details:  Jose Bentley 1995/06/03 [id number]  Ortho Devices Type of Ortho Device: Ace wrap, Volar splint Ortho Device/Splint Location: lue Ortho Device/Splint Interventions: Application As ordered by Dr. Verlan Friends, Ronne Stefanski 12/24/2014, 1:28 PM
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
## 2014-12-24 NOTE — Progress Notes (Signed)
OT Cancellation Note  Patient Details Name: Jose Bentley MRN: [id number] DOB: January 18, 1996   Cancelled Treatment:    Reason Eval/Treat Not Completed: Patient not medically ready (await CT results)  Felecia Shelling   OTR/L Pager: [phone] Office: [phone] .  12/24/2014, 11:43 AM
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
## 2014-12-24 NOTE — Progress Notes (Signed)
Rehab Admissions Coordinator Note:  Patient was screened by Clois Dupes for appropriateness for an Inpatient Acute Rehab Consult per PT and SLP recommendation.   At this time, we are recommending Inpatient Rehab consult. Noted pt has [employer]. Consult will be completed on Monday 8/29.  Clois Dupes 12/24/2014, 6:43 PM  I can be reached at [phone].

## 2014-12-25 LAB — CBC WITH DIFFERENTIAL/PLATELET
BASOS ABS: 0 10*3/uL (ref 0.0–0.1)
Basophils Relative: 0 % (ref 0–1)
Eosinophils Absolute: 0 10*3/uL (ref 0.0–0.7)
Eosinophils Relative: 0 % (ref 0–5)
HEMATOCRIT: 24.1 % — AB (ref 39.0–52.0)
Hemoglobin: 8.5 g/dL — ABNORMAL LOW (ref 13.0–17.0)
LYMPHS ABS: 1.1 10*3/uL (ref 0.7–4.0)
LYMPHS PCT: 11 % — AB (ref 12–46)
MCH: 31.1 pg (ref 26.0–34.0)
MCHC: 35.3 g/dL (ref 30.0–36.0)
MCV: 88.3 fL (ref 78.0–100.0)
MONO ABS: 1.1 10*3/uL — AB (ref 0.1–1.0)
Monocytes Relative: 11 % (ref 3–12)
NEUTROS ABS: 8 10*3/uL — AB (ref 1.7–7.7)
Neutrophils Relative %: 78 % — ABNORMAL HIGH (ref 43–77)
PLATELETS: 95 10*3/uL — AB (ref 150–400)
RBC: 2.73 MIL/uL — AB (ref 4.22–5.81)
RDW: 13.6 % (ref 11.5–15.5)
WBC: 10.3 10*3/uL (ref 4.0–10.5)

## 2014-12-25 LAB — BASIC METABOLIC PANEL
ANION GAP: 6 (ref 5–15)
BUN: 7 mg/dL (ref 6–20)
CALCIUM: 8 mg/dL — AB (ref 8.9–10.3)
CHLORIDE: 102 mmol/L (ref 101–111)
CO2: 25 mmol/L (ref 22–32)
CREATININE: 0.81 mg/dL (ref 0.61–1.24)
GFR calc Af Amer: >60 mL/min (ref >60)
GFR calc non Af Amer: >60 mL/min (ref >60)
GLUCOSE: 112 mg/dL — AB (ref 65–99)
POTASSIUM: 4 mmol/L (ref 3.5–5.1)
Sodium: 133 mmol/L — ABNORMAL LOW (ref 135–145)

## 2014-12-25 MED ORDER — WHITE PETROLATUM GEL
Status: AC
  Administered 2014-12-25: 1
  Filled 2014-12-25: qty 1

## 2014-12-25 NOTE — Progress Notes (Signed)
   Subjective: 3 Days Post-Op Procedure(s) (LRB): INTRAMEDULLARY (IM) NAIL FEMORAL (Right) IRRIGATION AND DEBRIDEMENT LEFT KNEE (Left)  Pt c/o moderate pain 'everywhere' LUE splint placed yesterday Currently kidney injury is stable but under close observation Patient reports pain as moderate.  Objective:   VITALS:   Filed Vitals:   12/25/14 0800  BP: 117/65  Pulse: 125  Temp:   Resp: 18    Right lower extremity with moderate ecchymosis, mild edema Well healing incisions to lateral leg on right  Dressing change to left knee with healing road rash, no infection nv intact distally Left hand: mild edema and ecchymosis s/p fracture, splint c/d/i nv intact distally Right shoulder/scapula with mild edema, painful rom  LABS  Recent Labs  12/23/14 2125 12/24/14 0552 12/25/14 0243  HGB 9.4* 9.0* 8.5*  HCT 26.7* 25.7* 24.1*  WBC 10.2 10.3 10.3  PLT 76* 89* 95*     Recent Labs  12/23/14 0940 12/24/14 0552 12/25/14 0243  NA 141 134* 133*  K 3.7 4.0 4.0  BUN CREATININE 1.00 0.88 0.81  GLUCOSE 115* 114* 112*     Assessment/Plan: 3 Days Post-Op Procedure(s) (LRB): INTRAMEDULLARY (IM) NAIL FEMORAL (Right) IRRIGATION AND DEBRIDEMENT LEFT KNEE (Left) NWB BUE, RLE Strict non weight bearing to right lower extremity Difficult to transfer with therapy due to the right scapula fracture, right femur fracture and left hand fractures non weight bearing for both.  Will be transfers only.  PT/mobilzation once cleared by trauma and Urology. Continue pain control and medical management Wound Care Will continue to follow
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
## 2014-12-26 MED ORDER — BISACODYL 10 MG RE SUPP
10.0000 mg | Freq: Every day | RECTAL | Status: DC
  Administered 2014-12-26: 10 mg via RECTAL
  Filled 2014-12-26 (×2): qty 1

## 2014-12-26 NOTE — Progress Notes (Signed)
Subjective: 4 Days Post-Op Procedure(s) (LRB): INTRAMEDULLARY (IM) NAIL FEMORAL (Right) IRRIGATION AND DEBRIDEMENT LEFT KNEE (Left) Patient reports pain as mild.  Well controlled with oral pain meds.  Objective: Vital signs in last 24 hours: Temp:  [99 F (37.2 C)-99.9 F (37.7 C)] 99.9 F (37.7 C) (08/28 0800) Pulse Rate:  [117-140] 117 (08/28 0700) Resp:  [13-25] 16 (08/28 0700) BP: (124-149)/(62-81) 132/81 mmHg (08/28 0700) SpO2:  [86 %-100 %] 100 % (08/28 0700) FiO2 (%):  [50 %] 50 % (08/27 1900)  Intake/Output from previous day: 08/27 0701 - 08/28 0700 In: 2450 [P.O.:720; I.V.:1650] Out: 3075 [Urine:3075] Intake/Output this shift:     Recent Labs  12/23/14 1240 12/23/14 2125 12/24/14 0552 12/25/14 0243  HGB 9.7* 9.4* 9.0* 8.5*    Recent Labs  12/24/14 0552 12/25/14 0243  WBC 10.3 10.3  RBC 2.91* 2.73*  HCT 25.7* 24.1*  PLT 89* 95*    Recent Labs  12/24/14 0552 12/25/14 0243  NA 134* 133*  K 4.0 4.0  CL 103 102  CO2 26 25  BUN 7 7  CREATININE 0.88 0.81  GLUCOSE 114* 112*  CALCIUM 7.8* 8.0*    Recent Labs  12/24/14 0552  INR 1.33    PE:  wn wd male in nad.  O2 by [place].  B LE wounds dressed and dry.  NVI at B LEs.  L hand splinted.  Intact sens to LT at radial, median and ulnar n dist.  Brisk cap refill at fingers.  Assessment/Plan: 4 Days Post-Op Procedure(s) (LRB): INTRAMEDULLARY (IM) NAIL FEMORAL (Right) IRRIGATION AND DEBRIDEMENT LEFT KNEE (Left) Up with therapy  NWB B LEs.  Toni Arthurs 12/26/2014, 8:26 AM
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
## 2014-12-27 ENCOUNTER — Encounter (HOSPITAL_COMMUNITY): Payer: Self-pay | Admitting: Radiology

## 2014-12-27 ENCOUNTER — Inpatient Hospital Stay (HOSPITAL_COMMUNITY): Payer: BLUE CROSS/BLUE SHIELD

## 2014-12-27 DIAGNOSIS — S069X9S Unspecified intracranial injury with loss of consciousness of unspecified duration, sequela: Secondary | ICD-10-CM

## 2014-12-27 DIAGNOSIS — S3690XS Unspecified injury of unspecified intra-abdominal organ, sequela: Secondary | ICD-10-CM

## 2014-12-27 DIAGNOSIS — S069X4S Unspecified intracranial injury with loss of consciousness of 6 hours to 24 hours, sequela: Secondary | ICD-10-CM

## 2014-12-27 DIAGNOSIS — S37001S Unspecified injury of right kidney, sequela: Secondary | ICD-10-CM

## 2014-12-27 LAB — CBC
HCT: 21.8 % — ABNORMAL LOW (ref 39.0–52.0)
Hemoglobin: 7.5 g/dL — ABNORMAL LOW (ref 13.0–17.0)
MCH: 30.9 pg (ref 26.0–34.0)
MCHC: 34.4 g/dL (ref 30.0–36.0)
MCV: 89.7 fL (ref 78.0–100.0)
PLATELETS: 166 10*3/uL (ref 150–400)
RBC: 2.43 MIL/uL — ABNORMAL LOW (ref 4.22–5.81)
RDW: 13.4 % (ref 11.5–15.5)
WBC: 9.3 10*3/uL (ref 4.0–10.5)

## 2014-12-27 MED ORDER — LORAZEPAM 2 MG/ML IJ SOLN
0.5000 mg | INTRAMUSCULAR | Status: DC | PRN
  Administered 2014-12-27: 1 mg via INTRAVENOUS
  Filled 2014-12-27: qty 1

## 2014-12-27 MED ORDER — IOHEXOL 300 MG/ML  SOLN
75.0000 mL | Freq: Once | INTRAMUSCULAR | Status: AC | PRN
  Administered 2014-12-27: 75 mL via INTRAVENOUS

## 2014-12-27 MED ORDER — CETYLPYRIDINIUM CHLORIDE 0.05 % MT LIQD
7.0000 mL | Freq: Two times a day (BID) | OROMUCOSAL | Status: DC
  Administered 2014-12-28 – 2014-12-30 (×5): 7 mL via OROMUCOSAL

## 2014-12-27 MED ORDER — CHLORHEXIDINE GLUCONATE 0.12 % MT SOLN
15.0000 mL | Freq: Two times a day (BID) | OROMUCOSAL | Status: DC
  Administered 2014-12-27 – 2014-12-31 (×7): 15 mL via OROMUCOSAL
  Filled 2014-12-27 (×5): qty 15

## 2014-12-27 NOTE — Progress Notes (Signed)
Patient ID: Jose Bentley, male   DOB: 1995-06-24, 19 y.o.   MRN: [id number] 5 Days Post-Op  Subjective: Feeling better, BM X2, tolerated clears  Objective: Vital signs in last 24 hours: Temp:  [97.9 F (36.6 C)-100.3 F (37.9 C)] 99.7 F (37.6 C) (08/29 0400) Pulse Rate:  [112-133] 112 (08/29 0800) Resp:  [18-30] 23 (08/29 0800) BP: (117-145)/(61-74) 134/68 mmHg (08/29 0800) SpO2:  [92 %-100 %] 99 % (08/29 0800) Last BM Date: 12/26/14  Intake/Output from previous day: 08/28 0701 - 08/29 0700 In: 2440 [P.O.:600; I.V.:1800] Out: 4425 [Urine:4425] Intake/Output this shift: Total I/O In: 75 [I.V.:75] Out: 245 [Urine:245]  General appearance: cooperative Resp: clear to auscultation bilaterally Cardio: regular rate and rhythm and hr 100 GI: soft, mild R side fullness, nontender, active BS Extremities: splint LUE, ortho dressing LLE, digits perfused  Lab Results: CBC   Recent Labs  12/25/14 0243 12/27/14 0416  WBC 10.3 9.3  HGB 8.5* 7.5*  HCT 24.1* 21.8*  PLT 95* 166   BMET  Recent Labs  12/25/14 0243  NA 133*  K 4.0  CL 102  CO2 25  GLUCOSE 112*  BUN 7  CREATININE 0.81  CALCIUM 8.0*   PT/INR No results for input(s): LABPROT, INR in the last 72 hours. ABG No results for input(s): PHART, HCO3 in the last 72 hours.  Invalid input(s): PCO2, PO2  Studies/Results: No results found.  Anti-infectives: Anti-infectives    Start     Dose/Rate Route Frequency Ordered Stop   12/22/14 2230  ceFAZolin (ANCEF) IVPB 2 g/50 mL premix     2 g 100 mL/hr over 30 Minutes Intravenous Every 6 hours 12/22/14 2011 12/23/14 0424      Assessment/Plan: [facility] Grade 4 renal lac - bedrest extended by urology and F/U CT ordered for today Grade 2 liver lac with possible ischemia to R lobe - Hb down a bit but likely equilibration, will F/U R femur FX - S/P IM nail by Dr. Ranell Patrick. R scapula FX - NWB RUE per Dr. Ranell Patrick L 2nd and 3rd [facility] FXs - splint per ortho B Pulmonary  contusion - IS, pulm toilet, improving, now on [place] O2 ABL anemia - As above FEN - clears, advance to fulls after CT VTE - PAS Dispo - ICU I spoke with his family at the bedside  LOS: 5 days    Violeta Gelinas, MD, MPH, FACS Trauma: [phone] General Surgery: [phone]  12/27/2014

## 2014-12-27 NOTE — Progress Notes (Signed)
SLP Cancellation Note  Patient Details Name: Jose Bentley MRN: [id number] DOB: 1995/06/30   Cancelled treatment:       Reason Eval/Treat Not Completed: Other (comment) (Patient refused due to pain, RN made aware)  Ferdinand Lango MA, CCC-SLP [phone]  Ferdinand Lango Meryl 12/27/2014, 11:42 AM
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
## 2014-12-27 NOTE — Progress Notes (Signed)
OT Cancellation Note  Patient Details Name: Jose Bentley MRN: [id number] DOB: 05/12/1995   Cancelled Treatment:    Reason Eval/Treat Not Completed: Medical issues which prohibited therapy Pton bedrest today. Discussed with Dr. Janee Morn. Will contact Dr. Ranell Patrick to see if pt can be WB through L elbow to assist with transfers given his L proximal 2nd/3rd metacarpal fxs.  [facility] Demiyah Fischbach, OTR/L  [phone] 12/27/2014 12/27/2014, 9:15 AM

## 2014-12-27 NOTE — Progress Notes (Signed)
Inpatient Rehabilitation  I met with pt's Uncle Shirlean Mylar in the [facility] waiting area.  We discussed pt's likely need for IP Rehab and process of seeking insurance authorization when appropriate.  Lovey Newcomer is in full support of IP Rehab for his nephew and states that his wife will be available as the 24 hour care provider. He states "we just want to do what is best for Shanon Brow".   I note that pt. Is on bedrest per urology,  with f/u CT pending.  I will follow up tomorrow.  Please call if questions.  [facility] Admissions Coordinator Cell [phone] Office [phone]

## 2014-12-27 NOTE — Progress Notes (Signed)
SLP Cancellation Note  Patient Details Name: Yuriy Cui MRN: [id number] DOB: 1995-09-22   Cancelled treatment:       Reason Eval/Treat Not Completed: Other (comment) (Patient on bedpan. Will f/u. )  Ferdinand Lango MA, CCC-SLP [phone]  Caron Tardif Meryl 12/27/2014, 9:44 AM

## 2014-12-27 NOTE — Progress Notes (Signed)
PT Cancellation Note  Patient Details Name: Jose Bentley MRN: [id number] DOB: [birth year]   Cancelled Treatment:    Reason Eval/Treat Not Completed: Medical issues which prohibited therapy.  Pt to CT this am.  Now awaiting results/clearance before mobility. 12/27/2014  [place] Bing, PT [phone]  (pager)   Nema Oatley, Eliseo Gum 12/27/2014, 1:27 PM
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
## 2014-12-28 DIAGNOSIS — S7291XS Unspecified fracture of right femur, sequela: Secondary | ICD-10-CM

## 2014-12-28 DIAGNOSIS — S06893S Other specified intracranial injury with loss of consciousness of 1 hour to 5 hours 59 minutes, sequela: Secondary | ICD-10-CM

## 2014-12-28 DIAGNOSIS — S42101S Fracture of unspecified part of scapula, right shoulder, sequela: Secondary | ICD-10-CM

## 2014-12-28 LAB — BASIC METABOLIC PANEL
ANION GAP: 8 (ref 5–15)
BUN: 11 mg/dL (ref 6–20)
CHLORIDE: 100 mmol/L — AB (ref 101–111)
CO2: 27 mmol/L (ref 22–32)
Calcium: 8.3 mg/dL — ABNORMAL LOW (ref 8.9–10.3)
Creatinine, Ser: 0.68 mg/dL (ref 0.61–1.24)
GFR calc non Af Amer: >60 mL/min (ref >60)
GLUCOSE: 115 mg/dL — AB (ref 65–99)
Potassium: 4.7 mmol/L (ref 3.5–5.1)
Sodium: 135 mmol/L (ref 135–145)

## 2014-12-28 LAB — CBC
HEMATOCRIT: 22.1 % — AB (ref 39.0–52.0)
HEMOGLOBIN: 7.4 g/dL — AB (ref 13.0–17.0)
MCH: 30.5 pg (ref 26.0–34.0)
MCHC: 33.5 g/dL (ref 30.0–36.0)
MCV: 90.9 fL (ref 78.0–100.0)
Platelets: 227 10*3/uL (ref 150–400)
RBC: 2.43 MIL/uL — ABNORMAL LOW (ref 4.22–5.81)
RDW: 13.5 % (ref 11.5–15.5)
WBC: 9.8 10*3/uL (ref 4.0–10.5)

## 2014-12-28 MED ORDER — ENSURE ENLIVE PO LIQD
237.0000 mL | Freq: Two times a day (BID) | ORAL | Status: DC
  Administered 2014-12-28 – 2014-12-30 (×5): 237 mL via ORAL

## 2014-12-28 MED ORDER — POLYETHYLENE GLYCOL 3350 17 G PO PACK
17.0000 g | PACK | Freq: Every day | ORAL | Status: DC
  Administered 2014-12-29 – 2014-12-31 (×3): 17 g via ORAL
  Filled 2014-12-28 (×4): qty 1

## 2014-12-28 MED ORDER — HYDROMORPHONE HCL 1 MG/ML IJ SOLN
0.5000 mg | INTRAMUSCULAR | Status: DC | PRN
  Administered 2014-12-28 – 2014-12-31 (×7): 0.5 mg via INTRAVENOUS
  Filled 2014-12-28 (×8): qty 1

## 2014-12-28 NOTE — Progress Notes (Signed)
Patient ID: Jose Bentley, male   DOB: [birth year], 19 y.o.   MRN: [id number]   LOS: 6 days   Subjective: Doing good this morning. Drank water most of day yesterday, tolerated just fine.   Objective: Vital signs in last 24 hours: Temp:  [97.9 F (36.6 C)-100.4 F (38 C)] 100 F (37.8 C) (08/30 0800) Pulse Rate:  [112-132] 119 (08/30 0800) Resp:  [14-33] 18 (08/30 0800) BP: (106-151)/(44-77) 131/67 mmHg (08/30 0800) SpO2:  [94 %-100 %] 96 % (08/30 0800) Last BM Date: 12/27/14   Laboratory  CBC  Recent Labs  12/27/14 0416 12/28/14 0419  WBC 9.3 9.8  HGB 7.5* 7.4*  HCT 21.8* 22.1*  PLT 166 227   BMET  Recent Labs  12/28/14 0419  NA 135  K 4.7  CL 100*  CO2 27  GLUCOSE 115*  BUN 11  CREATININE 0.68  CALCIUM 8.3*    Physical Exam General appearance: alert and no distress Resp: clear to auscultation bilaterally Cardio: Tachycardia GI: Soft, +BS, mild diffuse TTP Extremities: NVI   Assessment/Plan: [facility] R scapula FX - NWB RUE per Dr. Ranell Patrick L 2nd and 3rd [facility] FXs - splint per ortho, awaiting hand consult B Pulmonary contusion - IS, pulm toilet, improving, now on [place] O2 Grade 4 renal lac - No need for drainage yet, urology ok with mobilization Grade 2 liver lac with possible ischemia to R lobe - Hb down a bit but likely equilibration, will F/U R femur FX - S/P IM nail by Dr. Ranell Patrick, 25% WB ABL anemia - Stable FEN - Fulls, SL IV VTE - SCD's Dispo - Transfer to SDU, PT/OT    Freeman Caldron, PA-C Pager: [phone] General Trauma PA Pager: [phone]  12/28/2014

## 2014-12-28 NOTE — Progress Notes (Signed)
OT Cancellation Note  Patient Details Name: Jose Bentley MRN: [id number] DOB: 09-Jan-1996   Cancelled Treatment:    Reason Eval/Treat Not Completed: Medical issues which prohibited therapy Pt with active bedrest orders. Please update activity orders when appropriate for therapy. Thanks [facility] Britian Jentz, OTR/L  [phone] 12/28/2014 12/28/2014, 7:45 AM
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
## 2014-12-28 NOTE — Progress Notes (Signed)
Inpatient Rehabilitation  I note that pt. has orders for SDU and bed rest orders appear to have been discontinued.  I have requested that trauma PA Charma Igo reorder PT/OT when appropriate to allow me to submit for insurance authorization for IP Rehab.  I note hand surgeon consult is pending to address L hand fractures.  Please call if questions.  Weldon Picking PT Inpatient Rehab Admissions Coordinator Cell [phone] Office [phone]
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
## 2014-12-28 NOTE — Progress Notes (Signed)
Orthopedics Progress Note  Subjective: Patient feeling better  Objective:  Filed Vitals:   12/28/14 0700  BP: 123/62  Pulse: 121  Temp:   Resp: 22    General: Awake and alert  Musculoskeletal: left hand swelling down, moving fingers well Left knee ROM normal without pain, dressing intact, no cords Right hip dressing intact, wounds look good Swelling down significantly in the right shoulder, movement improving Neurovascularly intact  Lab Results  Component Value Date   WBC 9.8 12/28/2014   HGB 7.4* 12/28/2014   HCT 22.1* 12/28/2014   MCV 90.9 12/28/2014   PLT 227 12/28/2014       Component Value Date/Time   NA 135 12/28/2014 0419   K 4.7 12/28/2014 0419   CL 100* 12/28/2014 0419   CO2 27 12/28/2014 0419   GLUCOSE 115* 12/28/2014 0419   BUN 11 12/28/2014 0419   CREATININE 0.68 12/28/2014 0419   CALCIUM 8.3* 12/28/2014 0419   GFRNONAA >60 12/28/2014 0419   GFRAA >60 12/28/2014 0419    Lab Results  Component Value Date   INR 1.33 12/24/2014   INR 1.59* 12/23/2014   INR 1.23 12/22/2014    Assessment/Plan: POD #5 s/p Procedure(s): INTRAMEDULLARY (IM) NAIL FEMORAL IRRIGATION AND DEBRIDEMENT LEFT KNEE Continue NWB LUE, R UE, min WB Right LE WBAT L LE Awaiting hand surgeon opinion on the left hand with two MC fxs and trapezium fracture  Almedia Balls. Ranell Patrick, MD 12/28/2014 7:39 AM
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
## 2014-12-29 ENCOUNTER — Encounter (HOSPITAL_COMMUNITY): Payer: Self-pay | Admitting: *Deleted

## 2014-12-29 LAB — CBC
HCT: 22.8 % — ABNORMAL LOW (ref 39.0–52.0)
Hemoglobin: 7.6 g/dL — ABNORMAL LOW (ref 13.0–17.0)
MCH: 30.5 pg (ref 26.0–34.0)
MCHC: 33.3 g/dL (ref 30.0–36.0)
MCV: 91.6 fL (ref 78.0–100.0)
PLATELETS: 340 10*3/uL (ref 150–400)
RBC: 2.49 MIL/uL — ABNORMAL LOW (ref 4.22–5.81)
RDW: 13.7 % (ref 11.5–15.5)
WBC: 11.3 10*3/uL — AB (ref 4.0–10.5)

## 2014-12-29 MED ORDER — OXYCODONE HCL 5 MG PO TABS
10.0000 mg | ORAL_TABLET | ORAL | Status: DC | PRN
  Administered 2014-12-30: 15 mg via ORAL
  Administered 2014-12-30: 10 mg via ORAL
  Administered 2014-12-30: 15 mg via ORAL
  Administered 2014-12-30: 10 mg via ORAL
  Administered 2014-12-31 (×2): 20 mg via ORAL
  Filled 2014-12-29: qty 2
  Filled 2014-12-29: qty 4
  Filled 2014-12-29 (×2): qty 2
  Filled 2014-12-29 (×2): qty 3
  Filled 2014-12-29: qty 4

## 2014-12-29 MED ORDER — TRAMADOL HCL 50 MG PO TABS
100.0000 mg | ORAL_TABLET | Freq: Four times a day (QID) | ORAL | Status: DC
  Administered 2014-12-29 – 2014-12-31 (×9): 100 mg via ORAL
  Filled 2014-12-29 (×11): qty 2
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
## 2014-12-29 NOTE — Progress Notes (Signed)
Inpatient Rehabilitation  Pt. Was able to awaken to have a brief conversation with me today regarding likely upcoming rehab.  I spoke with Clayborne Dana, RN to have staff try to have pt. up in chair for each meal to prepare him for rehab.  I have submitted for insurance authorization.  I note the acute team awaits hand consult for fractures in left hand.  I have discussed this case with Sidney Ace, CM and Macario Golds, SW.  I will follow up tomorrow for possible CIR admission pending medical/surgical readiness, tolerance for activities and therapies, insurance approval and bed availability.  Please call if questions.  Weldon Picking PT Inpatient Rehab Admissions Coordinator Cell [phone] Office [phone]

## 2014-12-29 NOTE — Progress Notes (Signed)
Patient ID: Jose Bentley, male   DOB: 1995/05/03, 19 y.o.   MRN: [id number]   LOS: 7 days   Subjective: Having back pain this morning but otherwise ok. Pain meds working. Tolerated fulls yesterday.   Objective: Vital signs in last 24 hours: Temp:  [97.9 F (36.6 C)-100.4 F (38 C)] 97.9 F (36.6 C) (08/31 0812) Pulse Rate:  [115-134] 121 (08/31 0700) Resp:  [14-27] 15 (08/31 0700) BP: (108-141)/(57-76) 124/70 mmHg (08/31 0700) SpO2:  [94 %-99 %] 96 % (08/31 0700) Last BM Date: 12/27/14   Laboratory  CBC  Recent Labs  12/28/14 0419 12/29/14 0447  WBC 9.8 11.3*  HGB 7.4* 7.6*  HCT 22.1* 22.8*  PLT 227 340    Physical Exam General appearance: alert and no distress Resp: clear to auscultation bilaterally Cardio: Tachycardia GI: Soft, moderate TTP, +BS   Assessment/Plan: [facility] R scapula FX - NWB RUE per Dr. Ranell Patrick L 2nd and 3rd [facility] FXs - splint per ortho, awaiting hand consult B Pulmonary contusion - IS, pulm toilet, improving, now on [place] O2 Grade 4 renal lac - No need for drainage yet, urology ok with mobilization Grade 2 liver lac with possible ischemia to R lobe - Hb down a bit but likely equilibration, will F/U R femur FX - S/P IM nail by Dr. Ranell Patrick, 25% WB ABL anemia - Stable FEN - Advance to regular, increase OxyIR, add scheduled tramadol VTE - SCD's Dispo - Either transfer to floor or to CIR    Freeman Caldron, PA-C Pager: [phone] General Trauma PA Pager: [phone]  12/29/2014

## 2014-12-29 NOTE — Progress Notes (Signed)
ASKED BY DR. Ranell Patrick TO TAKE A LOOK AT LEFT HAND CHART REVIEWED AND FILMS REVIEWED PT WITH MULTIPLE METACARPAL FRACTURES, INDEX AND LONG, DISPLACED AND INTO [facility] JOINT PT RATHER TIRED/SEDATED TODAY  JUST GOT TRANSFERRED FROM ICU UNABLE TO FOLLOW CONVERSATION ABOUT OPTIONS FOR SURGERY/NON-SURGERY WILL BE BACK BY TOMORROW TO DISCUSS OPTIONS CONTINUE WITH CURRENT SPLINT AND IMMOBILIZATION

## 2014-12-29 NOTE — Progress Notes (Signed)
7 Days Post-Op Subjective: Patient reports that most of pain is in his lower back.  Objective: Vital signs in last 24 hours: Temp:  [97.9 F (36.6 C)-100.4 F (38 C)] 98.2 F (36.8 C) (08/31 1225) Pulse Rate:  [115-134] 119 (08/31 1225) Resp:  [14-27] 20 (08/31 1225) BP: (108-135)/(57-81) 135/81 mmHg (08/31 1225) SpO2:  [92 %-99 %] 92 % (08/31 1225) Weight:  [64.683 kg (142 lb 9.6 oz)] 64.683 kg (142 lb 9.6 oz) (08/31 1225)  Intake/Output from previous day: 08/30 0701 - 08/31 0700 In: 1122 [P.O.:967; I.V.:75] Out: 1550 [Urine:1550] Intake/Output this shift: Total I/O In: -  Out: 475 [Urine:475]  Physical Exam:  Constitutional: Vital signs reviewed. WD WN sleepy, but answers appropriately. Eyes: PERRL, No scleral icterus.   Pulmonary/Chest: Normal effort Abdominal: Soft. flank ecchymosis noted on right.     Lab Results:  Recent Labs  12/27/14 0416 12/28/14 0419 12/29/14 0447  HGB 7.5* 7.4* 7.6*  HCT 21.8* 22.1* 22.8*   BMET  Recent Labs  12/28/14 0419  NA 135  K 4.7  CL 100*  CO2 27  GLUCOSE 115*  BUN 11  CREATININE 0.68  CALCIUM 8.3*   No results for input(s): LABPT, INR in the last 72 hours. No results for input(s): LABURIN in the last 72 hours. Results for orders placed or performed during the hospital encounter of 12/22/14  MRSA PCR Screening     Status: None   Collection Time: 12/22/14  8:30 PM  Result Value Ref Range Status   MRSA by PCR NEGATIVE NEGATIVE Final    Comment:        The GeneXpert MRSA Assay (FDA approved for NASAL specimens only), is one component of a comprehensive MRSA colonization surveillance program. It is not intended to diagnose MRSA infection nor to guide or monitor treatment for MRSA infections.     Studies/Results: No results found.  Assessment/Plan:   Status post major laceration to right lower pole. His hemoglobin is stable. At this point, no further urologic intervention necessary, but I have ordered a  repeat CT scan for Friday to assess for presence of urinoma.   LOS: 7 days   Marcine Matar M 12/29/2014, 5:57 PM

## 2014-12-29 NOTE — Progress Notes (Signed)
Orthopedics Progress Note  Subjective: Patient resting comfortably.  He says the legs are feeling better.  Hand continues to improve  Objective:  Filed Vitals:   12/29/14 1225  BP: 135/81  Pulse: 119  Temp: 98.2 F (36.8 C)  Resp: 20    General: Awake and alert  Musculoskeletal: left hand bruised but much less swollen,  Right hip and leg dressings CDI, no erythema,  Left knee dressing CDI no pain with passive ROM of either knee Neurovascularly intact  Lab Results  Component Value Date   WBC 11.3* 12/29/2014   HGB 7.6* 12/29/2014   HCT 22.8* 12/29/2014   MCV 91.6 12/29/2014   PLT 340 12/29/2014       Component Value Date/Time   NA 135 12/28/2014 0419   K 4.7 12/28/2014 0419   CL 100* 12/28/2014 0419   CO2 27 12/28/2014 0419   GLUCOSE 115* 12/28/2014 0419   BUN 11 12/28/2014 0419   CREATININE 0.68 12/28/2014 0419   CALCIUM 8.3* 12/28/2014 0419   GFRNONAA >60 12/28/2014 0419   GFRAA >60 12/28/2014 0419    Lab Results  Component Value Date   INR 1.33 12/24/2014   INR 1.59* 12/23/2014   INR 1.23 12/22/2014    Assessment/Plan: POD #6 s/p Procedure(s): INTRAMEDULLARY (IM) NAIL FEMORAL IRRIGATION AND DEBRIDEMENT LEFT KNEE FRACTURES OF MC BONES LEFT HAND RIGHT SCAPULA FRACTURE Dr Bradly Bienenstock has graciously agreed to consult on the left hand after review of the CT scan and will discuss options for treatment with the patient.  Continue to mobilize patient as you can. NWB R UE for the scapula fracture (non op care planned), MIN WB RIGHT LE WBAT right LE  Almedia Balls. Ranell Patrick, MD 12/29/2014 4:09 PM

## 2014-12-30 DIAGNOSIS — D62 Acute posthemorrhagic anemia: Secondary | ICD-10-CM | POA: Diagnosis not present

## 2014-12-30 DIAGNOSIS — S27322A Contusion of lung, bilateral, initial encounter: Secondary | ICD-10-CM | POA: Diagnosis present

## 2014-12-30 DIAGNOSIS — H547 Unspecified visual loss: Secondary | ICD-10-CM

## 2014-12-30 DIAGNOSIS — S42101A Fracture of unspecified part of scapula, right shoulder, initial encounter for closed fracture: Secondary | ICD-10-CM | POA: Diagnosis present

## 2014-12-30 DIAGNOSIS — S6292XA Unspecified fracture of left wrist and hand, initial encounter for closed fracture: Secondary | ICD-10-CM | POA: Diagnosis present

## 2014-12-30 DIAGNOSIS — S36113A Laceration of liver, unspecified degree, initial encounter: Secondary | ICD-10-CM | POA: Diagnosis present
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
## 2014-12-30 NOTE — Progress Notes (Signed)
WILL CONTINUE TO TREAT HAND ON LEFT SIDE NONOP CONTINUE WITH CURRENT SPLINT ICE ELEVATE NO WEIGHT BEARING ON RIGHT HAND OK TO GO TO REHAB WILL NEED TO F/U WITH ME IN OFFICE IN 10-14 DAYS CONTACT ME IF ANY QUESTIONS [phone]

## 2014-12-30 NOTE — Progress Notes (Signed)
From GU standpoint, urethral catheter can come out at any time.
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
## 2014-12-30 NOTE — Progress Notes (Signed)
Inpatient Rehabilitation  Pt. currently up in recliner chair with family at bedside (2 "sisters" and his Renee Pain who was his legal guardian until age 19).  I note that Dr. Melvyn Novas will stop by again today, family plans to be at bedside to participate  in discussions with Dr. Melvyn Novas regarding his recommendation for pt's left hand.  Pt. also with need for f/u CT tomorrow per urology.   We await input from Dr. Melvyn Novas for planning CIR admission. Pt. and family aware.   Please call if questions.  Weldon Picking PT Inpatient Rehab Admissions Coordinator Cell [phone] Office [phone]
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
## 2014-12-31 ENCOUNTER — Inpatient Hospital Stay (HOSPITAL_COMMUNITY): Payer: BLUE CROSS/BLUE SHIELD

## 2014-12-31 ENCOUNTER — Inpatient Hospital Stay (HOSPITAL_COMMUNITY)
Admission: RE | Admit: 2014-12-31 | Discharge: 2015-01-05 | DRG: 949 | Disposition: A | Discharge status: Home Health | Payer: BLUE CROSS/BLUE SHIELD | Source: Intra-hospital | Attending: Physical Medicine & Rehabilitation | Admitting: Physical Medicine & Rehabilitation

## 2014-12-31 DIAGNOSIS — D62 Acute posthemorrhagic anemia: Secondary | ICD-10-CM

## 2014-12-31 DIAGNOSIS — F09 Unspecified mental disorder due to known physiological condition: Secondary | ICD-10-CM | POA: Diagnosis not present

## 2014-12-31 DIAGNOSIS — H547 Unspecified visual loss: Secondary | ICD-10-CM

## 2014-12-31 DIAGNOSIS — S37019A Minor contusion of unspecified kidney, initial encounter: Secondary | ICD-10-CM | POA: Insufficient documentation

## 2014-12-31 DIAGNOSIS — S36113S Laceration of liver, unspecified degree, sequela: Secondary | ICD-10-CM

## 2014-12-31 DIAGNOSIS — S069X3S Unspecified intracranial injury with loss of consciousness of 1 hour to 5 hours 59 minutes, sequela: Secondary | ICD-10-CM | POA: Diagnosis not present

## 2014-12-31 DIAGNOSIS — S069X9S Unspecified intracranial injury with loss of consciousness of unspecified duration, sequela: Secondary | ICD-10-CM

## 2014-12-31 DIAGNOSIS — S37011D Minor contusion of right kidney, subsequent encounter: Secondary | ICD-10-CM | POA: Diagnosis not present

## 2014-12-31 DIAGNOSIS — S37001S Unspecified injury of right kidney, sequela: Secondary | ICD-10-CM | POA: Diagnosis not present

## 2014-12-31 DIAGNOSIS — S069X1D Unspecified intracranial injury with loss of consciousness of 30 minutes or less, subsequent encounter: Secondary | ICD-10-CM | POA: Diagnosis not present

## 2014-12-31 DIAGNOSIS — S32009D Unspecified fracture of unspecified lumbar vertebra, subsequent encounter for fracture with routine healing: Secondary | ICD-10-CM | POA: Diagnosis not present

## 2014-12-31 DIAGNOSIS — S37031D Laceration of right kidney, unspecified degree, subsequent encounter: Secondary | ICD-10-CM | POA: Diagnosis not present

## 2014-12-31 DIAGNOSIS — S6292XA Unspecified fracture of left wrist and hand, initial encounter for closed fracture: Secondary | ICD-10-CM | POA: Diagnosis present

## 2014-12-31 DIAGNOSIS — S62303D Unspecified fracture of third metacarpal bone, left hand, subsequent encounter for fracture with routine healing: Secondary | ICD-10-CM | POA: Diagnosis not present

## 2014-12-31 DIAGNOSIS — S069X0D Unspecified intracranial injury without loss of consciousness, subsequent encounter: Secondary | ICD-10-CM | POA: Diagnosis not present

## 2014-12-31 DIAGNOSIS — S37001A Unspecified injury of right kidney, initial encounter: Secondary | ICD-10-CM | POA: Diagnosis present

## 2014-12-31 DIAGNOSIS — S069X1S Unspecified intracranial injury with loss of consciousness of 30 minutes or less, sequela: Secondary | ICD-10-CM | POA: Diagnosis not present

## 2014-12-31 DIAGNOSIS — S37091S Other injury of right kidney, sequela: Secondary | ICD-10-CM | POA: Diagnosis not present

## 2014-12-31 DIAGNOSIS — S37091D Other injury of right kidney, subsequent encounter: Secondary | ICD-10-CM

## 2014-12-31 DIAGNOSIS — N368 Other specified disorders of urethra: Secondary | ICD-10-CM | POA: Diagnosis present

## 2014-12-31 DIAGNOSIS — R52 Pain, unspecified: Secondary | ICD-10-CM | POA: Diagnosis not present

## 2014-12-31 DIAGNOSIS — S42111D Displaced fracture of body of scapula, right shoulder, subsequent encounter for fracture with routine healing: Secondary | ICD-10-CM | POA: Diagnosis present

## 2014-12-31 DIAGNOSIS — S62102D Fracture of unspecified carpal bone, left wrist, subsequent encounter for fracture with routine healing: Secondary | ICD-10-CM

## 2014-12-31 DIAGNOSIS — S62301D Unspecified fracture of second metacarpal bone, left hand, subsequent encounter for fracture with routine healing: Secondary | ICD-10-CM

## 2014-12-31 DIAGNOSIS — S6292XS Unspecified fracture of left wrist and hand, sequela: Secondary | ICD-10-CM | POA: Diagnosis not present

## 2014-12-31 DIAGNOSIS — F0280 Dementia in other diseases classified elsewhere without behavioral disturbance: Secondary | ICD-10-CM

## 2014-12-31 DIAGNOSIS — S72301D Unspecified fracture of shaft of right femur, subsequent encounter for closed fracture with routine healing: Secondary | ICD-10-CM

## 2014-12-31 DIAGNOSIS — S069XAS Unspecified intracranial injury with loss of consciousness status unknown, sequela: Secondary | ICD-10-CM

## 2014-12-31 DIAGNOSIS — IMO0002 Reserved for concepts with insufficient information to code with codable children: Secondary | ICD-10-CM

## 2014-12-31 MED ORDER — ACETAMINOPHEN 650 MG RE SUPP: 650.0000 mg | Freq: Four times a day (QID) | RECTAL | Status: DC | PRN

## 2014-12-31 MED ORDER — SILVER SULFADIAZINE 1 % EX CREA
TOPICAL_CREAM | Freq: Every day | CUTANEOUS | Status: DC
  Administered 2015-01-01 – 2015-01-02 (×2): via TOPICAL
  Filled 2014-12-31 (×2): qty 85

## 2014-12-31 MED ORDER — ACETAMINOPHEN 325 MG PO TABS
650.0000 mg | ORAL_TABLET | Freq: Four times a day (QID) | ORAL | Status: DC | PRN
  Administered 2014-12-31 – 2015-01-03 (×2): 650 mg via ORAL
  Filled 2014-12-31 (×2): qty 2

## 2014-12-31 MED ORDER — DOCUSATE SODIUM 100 MG PO CAPS
200.0000 mg | ORAL_CAPSULE | Freq: Two times a day (BID) | ORAL | Status: DC
  Administered 2014-12-31 – 2015-01-05 (×10): 200 mg via ORAL
  Filled 2014-12-31 (×11): qty 2

## 2014-12-31 MED ORDER — CETYLPYRIDINIUM CHLORIDE 0.05 % MT LIQD
7.0000 mL | Freq: Two times a day (BID) | OROMUCOSAL | Status: DC
  Administered 2015-01-04 – 2015-01-05 (×3): 7 mL via OROMUCOSAL

## 2014-12-31 MED ORDER — CHLORHEXIDINE GLUCONATE 0.12 % MT SOLN
15.0000 mL | Freq: Two times a day (BID) | OROMUCOSAL | Status: DC
  Administered 2015-01-01 – 2015-01-05 (×9): 15 mL via OROMUCOSAL
  Filled 2014-12-31 (×9): qty 15

## 2014-12-31 MED ORDER — OXYCODONE HCL 5 MG PO TABS
10.0000 mg | ORAL_TABLET | ORAL | Status: DC | PRN
  Administered 2014-12-31 (×2): 10 mg via ORAL
  Administered 2014-12-31 – 2015-01-04 (×18): 20 mg via ORAL
  Administered 2015-01-04 (×2): 10 mg via ORAL
  Administered 2015-01-04 – 2015-01-05 (×7): 20 mg via ORAL
  Filled 2014-12-31 (×3): qty 4
  Filled 2014-12-31: qty 2
  Filled 2014-12-31: qty 4
  Filled 2014-12-31: qty 2
  Filled 2014-12-31 (×14): qty 4
  Filled 2014-12-31: qty 2
  Filled 2014-12-31 (×2): qty 4
  Filled 2014-12-31: qty 2
  Filled 2014-12-31: qty 4
  Filled 2014-12-31: qty 2
  Filled 2014-12-31 (×4): qty 4

## 2014-12-31 MED ORDER — ENSURE ENLIVE PO LIQD
237.0000 mL | Freq: Two times a day (BID) | ORAL | Status: DC
  Administered 2015-01-01 – 2015-01-05 (×8): 237 mL via ORAL

## 2014-12-31 MED ORDER — POLYETHYLENE GLYCOL 3350 17 G PO PACK
17.0000 g | PACK | Freq: Every day | ORAL | Status: DC
  Administered 2015-01-01 – 2015-01-05 (×4): 17 g via ORAL
  Filled 2014-12-31 (×4): qty 1

## 2014-12-31 MED ORDER — ONDANSETRON HCL 4 MG PO TABS: 4.0000 mg | ORAL_TABLET | Freq: Four times a day (QID) | ORAL | Status: DC | PRN

## 2014-12-31 MED ORDER — SORBITOL 70 % SOLN
30.0000 mL | Freq: Every day | Status: DC | PRN
Start: 2014-12-31 — End: 2015-01-05

## 2014-12-31 MED ORDER — DOCUSATE SODIUM 100 MG PO CAPS
200.0000 mg | ORAL_CAPSULE | Freq: Two times a day (BID) | ORAL | Status: DC
  Administered 2014-12-31: 200 mg via ORAL
  Filled 2014-12-31 (×2): qty 2

## 2014-12-31 MED ORDER — IOHEXOL 300 MG/ML  SOLN
75.0000 mL | Freq: Once | INTRAMUSCULAR | Status: AC | PRN
  Administered 2014-12-31: 75 mL via INTRAVENOUS

## 2014-12-31 MED ORDER — MAGNESIUM CITRATE PO SOLN
1.0000 | Freq: Once | ORAL | Status: AC
  Administered 2014-12-31: 1 via ORAL
  Filled 2014-12-31: qty 296

## 2014-12-31 MED ORDER — ONDANSETRON HCL 4 MG/2ML IJ SOLN: 4.0000 mg | Freq: Four times a day (QID) | INTRAMUSCULAR | Status: DC | PRN

## 2014-12-31 NOTE — Progress Notes (Signed)
Orthopedics Progress Note  Subjective: Patient feeling better and moving arms and legs better  Objective:  Filed Vitals:   12/31/14 0800  BP:   Pulse: 100  Temp:   Resp: 18    General: Awake and alert  Musculoskeletal: right shoulder with min swelling and pain free ROM Left hand less swollen and splinted Left knee wound benign sutures out next week Right LE with well healing incisions, pain free hip and knee ROM, no cords Neurovascularly intact distally  Lab Results  Component Value Date   WBC 11.3* 12/29/2014   HGB 7.6* 12/29/2014   HCT 22.8* 12/29/2014   MCV 91.6 12/29/2014   PLT 340 12/29/2014       Component Value Date/Time   NA 135 12/28/2014 0419   K 4.7 12/28/2014 0419   CL 100* 12/28/2014 0419   CO2 27 12/28/2014 0419   GLUCOSE 115* 12/28/2014 0419   BUN 11 12/28/2014 0419   CREATININE 0.68 12/28/2014 0419   CALCIUM 8.3* 12/28/2014 0419   GFRNONAA >60 12/28/2014 0419   GFRAA >60 12/28/2014 0419    Lab Results  Component Value Date   INR 1.33 12/24/2014   INR 1.59* 12/23/2014   INR 1.23 12/22/2014    Assessment/Plan: POD #7 s/p Procedure(s): INTRAMEDULLARY (IM) NAIL FEMORAL IRRIGATION AND DEBRIDEMENT LEFT KNEE Stable for transfer to Rehab today. Continue local wound care sutures and staples out mid week next week Follow up with Dr Ranell Patrick in two weeks in the office  [phone]  Almedia Balls. Ranell Patrick, MD 12/31/2014 12:03 PM
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
## 2014-12-31 NOTE — Progress Notes (Signed)
I have received medical clearance from Charma Igo, trauma PA to admit pt. to CIR today.  I have updated pt., his uncle Ruta Hinds, pt's RN, Sidney Ace, CM and Macario Golds SW.  I will make necessary arrangements.    Weldon Picking PT Inpatient Rehab Admissions Coordinator Cell [phone] Office [phone]
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
## 2014-12-31 NOTE — Care Management Note (Signed)
Case Management Note  Patient Details  Name: Jose Bentley MRN: [id number] Date of Birth: 1995-10-15  Subjective/Objective:     Pt medically stable and accepted for admission to inpatient rehab facility.                 Action/Plan: Plan dc to [facility] later today.    Expected Discharge Date:   (unknown)               Expected Discharge Plan:  IP Rehab Facility  In-House Referral:     Discharge planning Services  CM Consult  Post Acute Care Choice:    Choice offered to:     DME Arranged:    DME Agency:     HH Arranged:    HH Agency:     Status of Service:  Completed, signed off  Medicare Important Message Given:    Date Medicare IM Given:    Medicare IM give by:    Date Additional Medicare IM Given:    Additional Medicare Important Message give by:     If discussed at Long Length of Stay Meetings, dates discussed:    Additional Comments:  Quintella Baton, RN, BSN  Trauma/Neuro ICU Case Manager [phone]
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
## 2014-12-31 NOTE — Progress Notes (Signed)
Patient being transferred to 4W rehab room 19. Report called and given to [name], Charity fundraiser. Patient ready for transfer. Belongings sent with patient. Family at bedside.

## 2014-12-31 NOTE — Progress Notes (Signed)
OT Cancellation Note  Patient Details Name: Trayvion Embleton MRN: [id number] DOB: June 30, 1995   Cancelled Treatment:    Reason Eval/Treat Not Completed: Other (comment) (per case manager note, plan is for patient to discharge to CIR today)  Caroleann Casler , MS, OTR/L, CLT Pager: [phone]  12/31/2014, 3:23 PM
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
## 2014-12-31 NOTE — Progress Notes (Signed)
Inpatient Rehabilitation  Per Dr. Glenna Durand note, pt.'s left hand to be managed nonoperatively with need for follow up in 10-14 days. Follow up CT completed this am and await decision for any further treatment.  Please call if questions.  Weldon Picking PT Inpatient Rehab Admissions Coordinator Cell [phone] Office [phone]

## 2015-01-01 ENCOUNTER — Inpatient Hospital Stay (HOSPITAL_COMMUNITY): Payer: BLUE CROSS/BLUE SHIELD | Admitting: *Deleted

## 2015-01-01 ENCOUNTER — Inpatient Hospital Stay (HOSPITAL_COMMUNITY): Payer: BLUE CROSS/BLUE SHIELD

## 2015-01-01 ENCOUNTER — Inpatient Hospital Stay (HOSPITAL_COMMUNITY): Payer: BLUE CROSS/BLUE SHIELD | Admitting: Occupational Therapy

## 2015-01-01 ENCOUNTER — Inpatient Hospital Stay (HOSPITAL_COMMUNITY): Payer: BLUE CROSS/BLUE SHIELD | Admitting: Speech Pathology

## 2015-01-01 DIAGNOSIS — S069X1D Unspecified intracranial injury with loss of consciousness of 30 minutes or less, subsequent encounter: Secondary | ICD-10-CM

## 2015-01-01 DIAGNOSIS — R52 Pain, unspecified: Secondary | ICD-10-CM

## 2015-01-01 MED ORDER — SALINE SPRAY 0.65 % NA SOLN
1.0000 | NASAL | Status: DC | PRN
  Filled 2015-01-01: qty 44

## 2015-01-01 MED ORDER — OXYCODONE HCL ER 10 MG PO T12A
10.0000 mg | EXTENDED_RELEASE_TABLET | Freq: Two times a day (BID) | ORAL | Status: DC
  Administered 2015-01-01 – 2015-01-05 (×8): 10 mg via ORAL
  Filled 2015-01-01 (×8): qty 1

## 2015-01-01 NOTE — Progress Notes (Signed)
VASCULAR LAB PRELIMINARY  PRELIMINARY  PRELIMINARY  PRELIMINARY  Bilateral lower extremity duplex completed.    Preliminary report:  There is no obvious evidence DVT or SVT noted in the visualized veins of the bilateral lower extremities.   Yobana Culliton, RVT 01/01/2015, 2:47 PM

## 2015-01-01 NOTE — Plan of Care (Signed)
Problem: RH PAIN MANAGEMENT Goal: RH STG PAIN MANAGED AT OR BELOW PT'S PAIN GOAL Outcome: Not Progressing Pain stays at 4-5 now. Scheduled oxy CR added by MD today
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
## 2015-01-01 NOTE — Progress Notes (Signed)
DID NOT HAVE CHANCE TO CATCH UP WITH FAMILY YESTERDAY WILL CONTINUE TO TREAT HAND FRACTURES CLOSED MANNER NO SURGERY CONTINUE WITH CURRENT SPLINT NO WEIGHT ON LEFT HAND WILL NEED TO F/U IN OFFICE WITH ME WHEN HE FOLLOWS UP WITH DR. Ranell Patrick PLEASE CONTACT ME DIRECTLY IF ANY QUESTIONS ABOUT HAND [phone]

## 2015-01-01 NOTE — Progress Notes (Signed)
Physical Therapy Session Note  Patient Details  Name: Jose Bentley MRN: [id number] Date of Birth: 26-May-1995  Today's Date: 01/01/2015 PT Individual Time: 1510-1545 PT Individual Time Calculation (min): 35 min   Short Term Goals: Week 1:  PT Short Term Goal 1 (Week 1): STG=LTG  Skilled Therapeutic Interventions/Progress Updates:  Pt's uncle stated pt had just fallen asleep and was in pain.  Pt awoke to our voices, restless due to pain.  PT rated low back pain 6/10 but appeared to be in more pain than he rated.  PT indicated he should use call bell, which he was able to do.  RN administered meds.  Pt c/o bed being uncomfortable and asked to get OOB. Pt able to state all WB precautions verbally, but not observe functionally. Pt sat EOB x 5 minutes during conversation and meds, then transferred to gold recliner with reminders and assistance for NWBing RLE.  Pt requested urinal and used it independently.  PT informed RN of voiding and red -colored urine.  Pt left resting , reclined in recliner, LEs elevated with pillow under lower legs, and pillow behind back, and cushion under bottom to alleviate pain in spine and lower back. All needs left within reach, and uncle in room. PT issued Vonna Kotyk Basic back cushion to be used in w/c, and instructed uncle in proper placement of w/c cushion and back.   Therapy Documentation Precautions:  Precautions Precautions: Fall Restrictions Weight Bearing Restrictions: Yes RUE Weight Bearing: Non weight bearing LUE Weight Bearing: Weight bear through elbow only RLE Weight Bearing: Non weight bearing LLE Weight Bearing: Weight bearing as tolerated Pain: Pain Assessment Pain Assessment: 0-10 Pain Score: 6  Pain Type: Acute pain Pain Location: Back Pain Descriptors / Indicators: Aching Pain Onset: On-going Pain Intervention(s): Medication (See eMAR)      Function:  Toileting Toileting     Toileting steps completed by helper: Adjust clothing prior to  toileting;Performs perineal hygiene;Adjust clothing after toileting   Assist level: Two helpers   Bed Mobility Roll left and right activity        Sit to lying activity        Lying to sitting activity   Assist level: Moderate assist (Pt 50 - 74%, lift 2 legs) Lying to sitting assistive device: Bedrails;HOB elevated  Mobility details Bed mobility details: Manual facilitation for weight shifting;Manual facilitation for weight bearing   Transfers Sit to stand transfer   Sit to stand assist level: Moderate assist (Pt 50 - 74%/lift 2 legs/lift or lower)    Chair/bed transfer   Chair/bed transfer method: Stand pivot Chair/bed transfer assist level: Moderate assist (Pt 50 - 74%/lift or lower)     Chair/bed transfer details: Manual facilitation for weight shifting;Verbal cues for technique;Tactile cues for initiation;Verbal cues for precautions/safety   Toilet transfer       Assist level to toilet: 2 helpers Assist level from toilet: 2 helpers      Armed forces operational officer Ambulation          Walk 10 feet activity      Walk 50 feet with 2 turns activity      Walk 150 feet activity      Walk 10 feet on uneven surfaces activity      Stairs          Walk up/down 1 step activity        Walk up/down 4 steps activity  Walk up/down 12 steps activity      Pick up small objects from floor      Wheelchair          Wheel 50 feet with 2 turns activity      Wheel 150 feet activity       Cognition Comprehension Comprehension assist level: Follows basic conversation/direction with no assist  Expression Expression assist level: Expresses basic needs/ideas: With extra time/assistive device  Social Interaction Social Interaction assist level: Interacts appropriately 90% of the time - Needs monitoring or encouragement for participation or interaction.  Problem Solving Problem solving assist level: Solves basic 25 - 49% of the time - needs direction more  than half the time to initiate, plan or complete simple activities  Memory Memory assist level: Recognizes or recalls 90% of the time/requires cueing < 10% of the time    Therapy/Group: Individual Therapy  Jose Bentley 01/01/2015, 4:41 PM

## 2015-01-02 ENCOUNTER — Inpatient Hospital Stay (HOSPITAL_COMMUNITY): Payer: BLUE CROSS/BLUE SHIELD

## 2015-01-02 ENCOUNTER — Inpatient Hospital Stay (HOSPITAL_COMMUNITY): Payer: BLUE CROSS/BLUE SHIELD | Admitting: Occupational Therapy

## 2015-01-02 DIAGNOSIS — IMO0002 Reserved for concepts with insufficient information to code with codable children: Secondary | ICD-10-CM | POA: Insufficient documentation

## 2015-01-02 LAB — BASIC METABOLIC PANEL
Anion gap: 8 (ref 5–15)
BUN: 12 mg/dL (ref 6–20)
CALCIUM: 9.2 mg/dL (ref 8.9–10.3)
CO2: 29 mmol/L (ref 22–32)
CREATININE: 0.68 mg/dL (ref 0.61–1.24)
Chloride: 96 mmol/L — ABNORMAL LOW (ref 101–111)
GFR calc Af Amer: >60 mL/min (ref >60)
Glucose, Bld: 111 mg/dL — ABNORMAL HIGH (ref 65–99)
Potassium: 4.2 mmol/L (ref 3.5–5.1)
SODIUM: 133 mmol/L — AB (ref 135–145)

## 2015-01-02 LAB — CBC
HEMATOCRIT: 28.7 % — AB (ref 39.0–52.0)
Hemoglobin: 9.4 g/dL — ABNORMAL LOW (ref 13.0–17.0)
MCH: 30.2 pg (ref 26.0–34.0)
MCHC: 32.8 g/dL (ref 30.0–36.0)
MCV: 92.3 fL (ref 78.0–100.0)
PLATELETS: 722 10*3/uL — AB (ref 150–400)
RBC: 3.11 MIL/uL — ABNORMAL LOW (ref 4.22–5.81)
RDW: 13.9 % (ref 11.5–15.5)
WBC: 11.4 10*3/uL — AB (ref 4.0–10.5)

## 2015-01-02 LAB — PROTIME-INR
INR: 1.24 (ref 0.00–1.49)
PROTHROMBIN TIME: 15.7 s — AB (ref 11.6–15.2)

## 2015-01-02 MED ORDER — MIDAZOLAM HCL 2 MG/2ML IJ SOLN
INTRAMUSCULAR | Status: AC
  Filled 2015-01-02: qty 4

## 2015-01-02 MED ORDER — MIDAZOLAM HCL 2 MG/2ML IJ SOLN
INTRAMUSCULAR | Status: AC | PRN
  Administered 2015-01-02: 1 mg via INTRAVENOUS
  Administered 2015-01-02: 2 mg via INTRAVENOUS

## 2015-01-02 MED ORDER — LIDOCAINE HCL 1 % IJ SOLN
INTRAMUSCULAR | Status: AC
  Filled 2015-01-02: qty 20

## 2015-01-02 NOTE — Procedures (Signed)
Interventional Radiology Procedure Note  Procedure:  CT guided placement of a 46F drain into the perinephric urinoma.  Aspiratoin yields 80 mL bloody urine.   Complications: None  Estimated Blood Loss: 0  Recommendations: - Keep drain to bag drainage - Flush tube Q shift - Will need CT abdomen with contrast to include delayed images documenting resolution of lower pole calyseal injury prior to tube removal.   Signed,  Sterling Big, MD

## 2015-01-02 NOTE — Progress Notes (Signed)
Physical Therapy Session Note  Patient Details  Name: Ociel Retherford MRN: [id number] Date of Birth: 05-07-1995  Today's Date: 01/02/2015  Pt missed 60 min of skilled PT due to refusal to participate. Pt up in w/c after procedure earlier and reports being to fatigued.    Karolee Stamps Darrol Poke, PT, DPT  01/02/2015, 3:35 PM

## 2015-01-02 NOTE — Progress Notes (Signed)
Physical Therapy Session Note  Patient Details  Name: Jose Bentley MRN: [id number] Date of Birth: Mar 26, 1996  Today's Date: 01/02/2015 PT Individual Time: 1000-1054 PT Individual Time Calculation (min): 54 min   Short Term Goals: Week 1:  PT Short Term Goal 1 (Week 1): STG=LTG  Skilled Therapeutic Interventions/Progress Updates:    Pt scheduled for procedure this afternoon and currently NPO. Due to this and pt reports getting very thirsty when doing therapy, declines therapy session. Sister present as well and request to want to be checked off on transfers.   Focused on family education with pt and pt's sister in regards to return demonstration for transfer from w/c to recliner (sister able to do so safety and checked off on safety plan with mod A for stand pivot), PT goals and POC and limitations in progressing mobility due to multiple WB restrictions (pt asked about using a crutch), w/c parts management and breakdown for loading into a car and demonstration of legrests (therapist got ELR for edema and positioning but did not get to adjust due to pt no longer sitting in w/c and declining further mobility), discussion in regards to home set-up at uncles and options if w/c does not fit in bathroom, therapist demonstrated and gave handout for bumping w/c up step for home entry (therapist put anti-tippers on w/c in room), handout also given and reviewed for transfers, and education on starting BLE HEP (handout left in room with plan to begin tomorrow when pt agreeable). Discussed car transfer training as well and plan to do in future session when pt agreeable.   Therapy Documentation Precautions:  Precautions Precautions: Fall Restrictions Weight Bearing Restrictions: Yes RUE Weight Bearing: Non weight bearing LUE Weight Bearing: Weight bear through elbow only RLE Weight Bearing: Non weight bearing LLE Weight Bearing: Weight bearing as tolerated General: PT Amount of Missed Time (min): 36  Minutes PT Missed Treatment Reason: Patient unwilling to participate Pain: Did not rate pain. Complaining of being thirsty and NPO.  Function:    Transfers Sit to stand transfer   Sit to stand assist level: Moderate assist (Pt 50 - 74%/lift 2 legs/lift or lower)    Chair/bed transfer   Chair/bed transfer method: Stand pivot Chair/bed transfer assist level: Moderate assist (Pt 50 - 74%/lift or lower)     Chair/bed transfer details: Manual facilitation for weight shifting;Verbal cues for technique;Tactile cues for initiation;Verbal cues for precautions/safety   Toilet transfer                Car transfer          Locomotion Ambulation          Walk 10 feet activity      Walk 50 feet with 2 turns activity      Walk 150 feet activity      Walk 10 feet on uneven surfaces activity      Stairs          Walk up/down 1 step activity        Walk up/down 4 steps activity      Walk up/down 12 steps activity      Pick up small objects from floor      Wheelchair          Wheel 50 feet with 2 turns activity      Wheel 150 feet activity       Cognition Comprehension Comprehension assist level: Follows basic conversation/direction with no assist  Expression Expression assist level: Expresses basic  needs/ideas: With extra time/assistive device  Social Interaction Social Interaction assist level: Interacts appropriately 90% of the time - Needs monitoring or encouragement for participation or interaction.  Problem Solving Problem solving assist level: Solves basic problems with no assist  Memory Memory assist level: Recognizes or recalls 90% of the time/requires cueing < 10% of the time    Therapy/Group: Individual Therapy  Karolee Stamps Darrol Poke, PT, DPT  01/02/2015, 11:15 AM
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
## 2015-01-02 NOTE — Progress Notes (Signed)
CT shows evidence of urinoma approximating right lower pole. Will have on call MD see today. Will plan on percutaneous drainage early in week unless symptomatic, then will drain more urgently.

## 2015-01-02 NOTE — Progress Notes (Signed)
Occupational Therapy Session Note  Patient Details  Name: Jose Bentley MRN: [id number] Date of Birth: May 26, 1995  Today's Date: 01/02/2015 OT Individual Time:  -   0800-0900  (60 min)      Short Term Goals: Week 1:  OT Short Term Goal 1 (Week 1): STGs=LTGS secondary to estimated short LOS Week 2:     Skilled Therapeutic Interventions/Progress Updates:   Pt sitting in wc.  He complained of not being able to rest in hospital bed.  Transferred from recliner to wc and set up at sink for bathing and dressing.  Transferred with LUE hold to OT's shoulder and performed stand pivot.  Pt. Engaged in UB bathing, grooming, dressing.  Did stand with mod assist to wash peri area.  Pt rolled wc to gym and used LLE with mod assist.  He stated he felt better by end of session.    Therapy Documentation Precautions:  Precautions Precautions: Fall Restrictions Weight Bearing Restrictions: Yes RUE Weight Bearing: Non weight bearing LUE Weight Bearing: Weight bear through elbow only RLE Weight Bearing: Non weight bearing LLE Weight Bearing: Weight bearing as tolerated General:  Vital Signs: TBP: 129/65 mmHg Patient Position (if appropriate): Lying Oxygen Therapy Pain: Pain Assessment Pain Assessment: 0-10 Pain Score: 5  Pain Type: Acute pain Pain Location: Back Pain Orientation: Right;Lower Pain Descriptors / Indicators: Sore Pain Onset: On-going Pain Intervention(s): Medication (See eMAR) ADL:       Function:   Eating Eating   Eating Assist Level: Set up assist for   Eating Set Up Assist For: Opening containers       Grooming Oral Care,Brush Teeth, Clean Dentures Activity:      Assist Level: Set up      Wash, Rinse, Dry Face Activity          Wash, Rinse, Dry Hands Activity          Brush, Comb Hair Activity   Assist Level: Set up to obtain items    Shave Activity          Apply Makeup Activity Apply makeup activity did not occur: Patient does not wear  makeup                                                           Bathing Bathing position   Position: Bed (supine in bed with HOB up)  Bathing parts Body parts bathed by patient: Front perineal area Body parts bathed by helper: Right arm;Left arm;Chest;Abdomen;Right upper leg;Left upper leg;Right lower leg;Left lower leg;Back;Buttocks  Bathing assist Assist Level: 2 helpers       Upper Body Dressing/Undressing Upper body dressing   What is the patient wearing?: Pull over shirt/dress       Pull over shirt/dress - Perfomed by helper: Thread/unthread right sleeve;Thread/unthread left sleeve;Put head through opening;Pull shirt over trunk        Upper body assist         Lower Body Dressing/Undressing Lower body dressing   What is the patient wearing?: Underwear;Pants;Socks   Underwear - Performed by helper: Thread/unthread right underwear leg;Thread/unthread left underwear leg;Pull underwear up/down   Pants- Performed by helper: Thread/unthread right pants leg;Thread/unthread left pants leg;Pull pants up/down;Fasten/unfasten pants  Lower body assist         Toileting Toileting   Toileting steps completed by patient: Adjust clothing prior to toileting;Performs perineal hygiene;Adjust clothing after toileting Toileting steps completed by helper: Adjust clothing prior to toileting;Performs perineal hygiene;Adjust clothing after toileting Toileting Assistive Devices: Toilet aid  Toileting assist      Bed Mobility Roll left and right activity   Assist level: Mod assist (Pt 50 - 74%, lift 2 legs)    Sit to lying activity   Assist level: Mod assist (Pt 50 - 74%, lift 2 legs)    Lying to sitting activity     Lying to sitting assistive device: Bedrails;HOB elevated  Mobility details Bed mobility details: Manual facilitation for weight shifting;Manual facilitation for weight bearing    Transfers Sit to stand transfer   Sit to stand assist  level: Moderate assist (Pt 50 - 74%/lift 2 legs/lift or lower)    Chair/bed transfer              Toilet transfer                Tub/shower transfer             Cognition Comprehension Comprehension assist level: Follows basic conversation/direction with extra time/assistive device  Expression Expression assist level: Expresses basic needs/ideas: With extra time/assistive device  Social Interaction Social Interaction assist level: Interacts appropriately 90% of the time - Needs monitoring or encouragement for participation or interaction.  Problem Solving Problem solving assist level: Solves basic problems with no assist  Memory Memory assist level: Recognizes or recalls 90% of the time/requires cueing < 10% of the time    Therapy/Group: Individual Therapy  Humberto Seals 01/02/2015, 6:12 PM

## 2015-01-03 ENCOUNTER — Inpatient Hospital Stay (HOSPITAL_COMMUNITY): Payer: BLUE CROSS/BLUE SHIELD | Admitting: Speech Pathology

## 2015-01-03 ENCOUNTER — Inpatient Hospital Stay (HOSPITAL_COMMUNITY): Payer: BLUE CROSS/BLUE SHIELD

## 2015-01-03 ENCOUNTER — Inpatient Hospital Stay (HOSPITAL_COMMUNITY): Payer: BLUE CROSS/BLUE SHIELD | Admitting: *Deleted

## 2015-01-03 ENCOUNTER — Inpatient Hospital Stay (HOSPITAL_COMMUNITY): Payer: BLUE CROSS/BLUE SHIELD | Admitting: Physical Therapy

## 2015-01-03 ENCOUNTER — Inpatient Hospital Stay (HOSPITAL_COMMUNITY): Payer: BLUE CROSS/BLUE SHIELD | Admitting: Occupational Therapy

## 2015-01-03 DIAGNOSIS — D62 Acute posthemorrhagic anemia: Secondary | ICD-10-CM

## 2015-01-03 DIAGNOSIS — S37019A Minor contusion of unspecified kidney, initial encounter: Secondary | ICD-10-CM | POA: Insufficient documentation

## 2015-01-03 DIAGNOSIS — S37001S Unspecified injury of right kidney, sequela: Secondary | ICD-10-CM

## 2015-01-03 DIAGNOSIS — S6292XS Unspecified fracture of left wrist and hand, sequela: Secondary | ICD-10-CM

## 2015-01-03 DIAGNOSIS — F09 Unspecified mental disorder due to known physiological condition: Secondary | ICD-10-CM

## 2015-01-03 DIAGNOSIS — S069X1S Unspecified intracranial injury with loss of consciousness of 30 minutes or less, sequela: Secondary | ICD-10-CM

## 2015-01-03 LAB — CBC WITH DIFFERENTIAL/PLATELET
BASOS ABS: 0.1 10*3/uL (ref 0.0–0.1)
BASOS PCT: 1 % (ref 0–1)
Eosinophils Absolute: 0.2 10*3/uL (ref 0.0–0.7)
Eosinophils Relative: 2 % (ref 0–5)
HEMATOCRIT: 25.4 % — AB (ref 39.0–52.0)
HEMOGLOBIN: 8.4 g/dL — AB (ref 13.0–17.0)
Lymphocytes Relative: 19 % (ref 12–46)
Lymphs Abs: 2.1 10*3/uL (ref 0.7–4.0)
MCH: 30.3 pg (ref 26.0–34.0)
MCHC: 33.1 g/dL (ref 30.0–36.0)
MCV: 91.7 fL (ref 78.0–100.0)
Monocytes Absolute: 1.3 10*3/uL — ABNORMAL HIGH (ref 0.1–1.0)
Monocytes Relative: 12 % (ref 3–12)
NEUTROS ABS: 7.2 10*3/uL (ref 1.7–7.7)
NEUTROS PCT: 66 % (ref 43–77)
Platelets: 655 10*3/uL — ABNORMAL HIGH (ref 150–400)
RBC: 2.77 MIL/uL — ABNORMAL LOW (ref 4.22–5.81)
RDW: 14 % (ref 11.5–15.5)
WBC: 10.8 10*3/uL — AB (ref 4.0–10.5)

## 2015-01-03 LAB — COMPREHENSIVE METABOLIC PANEL
ALBUMIN: 3.1 g/dL — AB (ref 3.5–5.0)
ALT: 39 U/L (ref 17–63)
AST: 41 U/L (ref 15–41)
Alkaline Phosphatase: 70 U/L (ref 38–126)
Anion gap: 10 (ref 5–15)
BILIRUBIN TOTAL: 3.3 mg/dL — AB (ref 0.3–1.2)
BUN: 11 mg/dL (ref 6–20)
CO2: 28 mmol/L (ref 22–32)
Calcium: 8.8 mg/dL — ABNORMAL LOW (ref 8.9–10.3)
Chloride: 96 mmol/L — ABNORMAL LOW (ref 101–111)
Creatinine, Ser: 0.64 mg/dL (ref 0.61–1.24)
GFR calc Af Amer: >60 mL/min (ref >60)
GFR calc non Af Amer: >60 mL/min (ref >60)
GLUCOSE: 105 mg/dL — AB (ref 65–99)
POTASSIUM: 3.7 mmol/L (ref 3.5–5.1)
Sodium: 134 mmol/L — ABNORMAL LOW (ref 135–145)
TOTAL PROTEIN: 6.3 g/dL — AB (ref 6.5–8.1)
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
## 2015-01-03 NOTE — Progress Notes (Signed)
Physical Therapy Session Note  Patient Details  Name: Jose Bentley MRN: [id number] Date of Birth: 08-Jan-1996  Today's Date: 01/03/2015 PT Individual Time: 0800-0900 PT Individual Time Calculation (min): 60 min   Short Term Goals: Week 1:  PT Short Term Goal 1 (Week 1): STG=LTG  Skilled Therapeutic Interventions/Progress Updates:    Session focused on family education with pt and pt's brother, Jose Bentley, including basic transfers, bed mobility and transfers in ADL apartment on flat bed to simulate home environment, w/c parts management and set-up for transfers, simulated car transfer (mod A), bumping w/c up/down 1 step to simulate home entry, and education on body mechanics and safety with mobility due to multiple WB restrictions. Ronnie able to safely return demonstrate all aspects of mobility and pt able to direct care independently. Pt overall requires mod A for transfers due to WB restrictions. Discussed plan for HEP to be initiated this PM PT session.   Therapy Documentation Precautions:  Precautions Precautions: Fall Restrictions Weight Bearing Restrictions: Yes RUE Weight Bearing: Non weight bearing LUE Weight Bearing: Weight bear through elbow only RLE Weight Bearing: Non weight bearing LLE Weight Bearing: Weight bearing as tolerated   Pain: Reports pain is manageable - premedicated.    Karolee Stamps Darrol Poke, PT, DPT  01/03/2015, 12:02 PM
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
## 2015-01-03 NOTE — Progress Notes (Signed)
Speech Language Pathology Daily Session Note  Patient Details  Name: Jose Bentley MRN: [id number] Date of Birth: 1996/01/01  Today's Date: 01/03/2015 SLP Individual Time: 1000-1100 SLP Individual Time Calculation (min): 60 min  Short Term Goals: Week 1: SLP Short Term Goal 1 (Week 1): Pt will recognize and correct errors in the moment during semi-complex self care and/or home management tasks with min assist verbal cues.  SLP Short Term Goal 2 (Week 1): Pt will utilize compensatory aids to recall daily, complex information with supervision.  SLP Short Term Goal 3 (Week 1): Pt will selectively attend to a semi-complex task for 7-10 minutes with supervision    SLP Short Term Goal 4 (Week 1): Pt will complete semi-complex self care and/or home management tasks for 80% accuracy with min assist verbal cues for functional problem solving.  SLP Short Term Goal 5 (Week 1): Pt will recall and utilize weight bearing precautions during functional tasks with min assist verbal cues.    Skilled Therapeutic Interventions: Skilled treatment session focused on addressing cognitive-linguistic goals. SLP facilitated session by providing instruction on a semi-complex, new learning task.  Patient completed task with Min verbal cues to recall instructions and recognize errors.  Patient also to self-correct once errors were identified.  Patient was able to recall weight bearing restrictions with Supervision level question cues.  Continue with current plan of care.   Function:  Cognition Comprehension Comprehension assist level: Follows basic conversation/direction with extra time/assistive device  Expression   Expression assist level: Expresses basic needs/ideas: With no assist  Social Interaction Social Interaction assist level: Interacts appropriately with others with medication or extra time (anti-anxiety, antidepressant).  Problem Solving Problem solving assist level: Solves basic problems with no assist   Memory Memory assist level: Recognizes or recalls 90% of the time/requires cueing < 10% of the time    Pain Pain Assessment Pain Assessment: No/denies pain  Therapy/Group: Individual Therapy  Jose Bentley., CCC-SLP [phone]  Jose Bentley 01/03/2015, 12:56 PM
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
## 2015-01-03 NOTE — Care Management Note (Signed)
[facility] Individual Statement of Services  Patient Name:  Jose Bentley  Date:  01/03/2015  Welcome to the [facility].  Our goal is to provide you with an individualized program based on your diagnosis and situation, designed to meet your specific needs.  With this comprehensive rehabilitation program, you will be expected to participate in at least 3 hours of rehabilitation therapies Monday-Friday, with modified therapy programming on the weekends.  Your rehabilitation program will include the following services:  Physical Therapy (PT), Occupational Therapy (OT), Speech Therapy (ST), 24 hour per day rehabilitation nursing, Therapeutic Recreaction (TR), Neuropsychology, Case Management (Social Worker), Rehabilitation Medicine, Nutrition Services and Pharmacy Services  Weekly team conferences will be held on Tuesdays to discuss your progress.  Your Social Worker will talk with you frequently to get your input and to update you on team discussions.  Team conferences with you and your family in attendance may also be held.  Expected length of stay: 5-7 days  Overall anticipated outcome: minimal assistance  Depending on your progress and recovery, your program may change. Your Social Worker will coordinate services and will keep you informed of any changes. Your Social Worker's name and contact numbers are listed  below.  The following services may also be recommended but are not provided by the [facility]:   Driving Evaluations  Home Health Rehabiltiation Services  Outpatient Rehabilitation Services  Vocational Rehabilitation   Arrangements will be made to provide these services after discharge if needed.  Arrangements include referral to agencies that provide these services.  Your insurance has been verified to be:  BCBS Your primary doctor is:  None (Child psychotherapist can assist with setting up primary care  physician)  Pertinent information will be shared with your doctor and your insurance company.  Social Worker:  [name], [name] [phone] or ([phone]   Information discussed with and copy given to patient by: [name], 01/03/2015, 2:26 PM

## 2015-01-03 NOTE — Progress Notes (Signed)
Occupational Therapy Note  Patient Details  Name: Jose Bentley MRN: [id number] Date of Birth: 1996/02/03  Today's Date: 01/03/2015 OT Individual Time: 1400-1430 OT Individual Time Calculation (min): 30 min   Pain:  Pt stated he had geneal pain all over, but had been premedicated and that he could tolerate it. Pt seen this session to address adaptive equipment to increase independence with self care. Pt is have some difficulty with self feeding as he cannot move L wrist. Provided pt with curved fork and spoon to try with his next two meals to determine if they are helpful.  Pt can grasp and hold open cup but can not grasp around cup. Discussed with uncle using small plastic cups at home and purchasing inexpensive spoon and fork to bend as needed to create a curved utensil. Pt stood as sink with min A to stabilize balance and attempted to use long sponge in simulated manner and a toilet aid in simulated manner, but too difficult to manage at this time with L hand cast. Provided uncle with info on how to get that equipment in the future if the pt could benefit from it when he is allowed to do more wt bearing on R foot or increased wrist mobility.  Provided pt with eye patch to use to aid with diplopia. Educated pt and uncle to rotate patch between eyes every 2-4 hours or less if L eye becomes fatigued. Pt has blurry vision in L eye but not the R eye.  Pt resting in chair with all needs met and uncle in the room with the pt.  [place] 01/03/2015, 2:59 PM
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
## 2015-01-03 NOTE — Progress Notes (Signed)
Physical Therapy Session Note  Patient Details  Name: Jose Bentley MRN: [id number] Date of Birth: 04-Feb-1996  Today's Date: 01/03/2015 PT Individual Time: 1300-1355 PT Individual Time Calculation (min): 55 min   Short Term Goals: Week 1:  PT Short Term Goal 1 (Week 1): STG=LTG  Skilled Therapeutic Interventions/Progress Updates:   Pt received in w/c with uncle present; agreeable to practice car transfer and one step negotiation in the w/c.  Pt and uncle demonstrated safe stand pivot w/c <> car with mod A under. Educated uncle on w/c parts management and folding/unfolding w/c for storage.  Discussed equipment needs for home and alerted SW.  Demonstrated to uncle w/c bumping up/down one step for home entry/exit; uncle gave repeat demonstration.  Pt transferred stand pivot w/c<>mat and sit <> supine as below to perform bilat LE HEP.  Pt educated on exercises on HEP sheet and instructed in return demonstration. For supine > sit from completely flat bed pt instructed on sequence for rolling to L side and performing L side > sit while maintaining WB precautions with min A. Returned to room and left in w/c with OT.  Therapy Documentation Precautions:  Precautions Precautions: Fall Restrictions Weight Bearing Restrictions: Yes RUE Weight Bearing: Non weight bearing LUE Weight Bearing: Weight bear through elbow only RLE Weight Bearing: Non weight bearing LLE Weight Bearing: Weight bearing as tolerated  Vital Signs: Therapy Vitals Temp: 98.1 F (36.7 C) Temp Source: Axillary Pain: Pain Assessment Pain Assessment: No/denies pain Faces Pain Scale: Hurts even more Pain Type: Acute pain Pain Location: Back Pain Orientation: Lower Pain Descriptors / Indicators: Sore Pain Onset: Sudden (during sit > supine) Pain Intervention(s): Repositioned  Function:   Bed Mobility Roll left and right activity   Assist level: Supervision or verbal cues    Sit to lying activity   Assist level:  Touching or steadying assistance (Pt > 75%, lift 1 leg)    Lying to sitting activity   Assist level: Touching or steadying assistance (Pt > 75%, lift 1 leg)    Mobility details Bed mobility details: Tactile cues for initiation;Visual cues/gestures for sequencing;Verbal cues for techniques;Visual cues/gestures for precautions/safety;Verbal cues for sequencing;Verbal cues for precautions/safety   Transfers Sit to stand transfer   Sit to stand assist level: Moderate assist (Pt 50 - 74%/lift 2 legs/lift or lower)    Chair/bed transfer   Chair/bed transfer method: Stand pivot Chair/bed transfer assist level: Moderate assist (Pt 50 - 74%/lift or lower) Chair/bed transfer assistive device: Other (Staff assist under elbow)   Chair/bed transfer details: Manual facilitation for weight shifting   Hotel manager transfer assist level: Moderate assist (Pt 50 - 74%/lift or lower, lift 2 legs)    Cognition Comprehension Comprehension assist level: Follows basic conversation/direction with extra time/assistive device  Expression Expression assist level: Expresses basic needs/ideas: With no assist  Social Interaction Social Interaction assist level: Interacts appropriately with others with medication or extra time (anti-anxiety, antidepressant).  Problem Solving Problem solving assist level: Solves basic problems with no assist  Memory Memory assist level: Recognizes or recalls 90% of the time/requires cueing < 10% of the time    Therapy/Group: Individual Therapy  Edman Circle Faucette 01/03/2015, 4:09 PM

## 2015-01-03 NOTE — Progress Notes (Signed)
Occupational Therapy Session Note  Patient Details  Name: Jose Bentley MRN: [id number] Date of Birth: [birth year]  Today's Date: 01/03/2015 OT Individual Time: 0900-1000 OT Individual Time Calculation (min): 60 min    Short Term Goals: Week 1:  OT Short Term Goal 1 (Week 1): STGs=LTGS secondary to estimated short LOS  Skilled Therapeutic Interventions/Progress Updates:    Pt resting in w/c with family member present upon arrival.  Pt stated he did not need to wash up and change clothing.  OT session focused on transfer training, DME recommendations, family education, and discharge planning.  Pt practiced tub bench transfers, toilet transfers, bed transfers, and bed mobility. Pt's family member practiced SPT w/fc<>bed X 2.  Pt directs care/assistance appropriately.  Pt requires steadying assist with sit<>stand and SPT.  Recommended tub transfer bench and BSC.  Educated family member on level of assistance that will be required at discharge. Pt and family member verbalized understanding.   Therapy Documentation Precautions:  Precautions Precautions: Fall Restrictions Weight Bearing Restrictions: Yes RUE Weight Bearing: Non weight bearing LUE Weight Bearing: Weight bear through elbow only RLE Weight Bearing: Non weight bearing LLE Weight Bearing: Weight bearing as tolerated   Pain: Pt c/o 5/10 generalized pain; RN aware    Therapy/Group: Individual Therapy  Rich Brave 01/03/2015, 10:03 AM

## 2015-01-04 ENCOUNTER — Inpatient Hospital Stay (HOSPITAL_COMMUNITY): Payer: BLUE CROSS/BLUE SHIELD

## 2015-01-04 ENCOUNTER — Inpatient Hospital Stay (HOSPITAL_COMMUNITY): Payer: BLUE CROSS/BLUE SHIELD | Admitting: Physical Therapy

## 2015-01-04 ENCOUNTER — Inpatient Hospital Stay (HOSPITAL_COMMUNITY): Payer: BLUE CROSS/BLUE SHIELD | Admitting: Speech Pathology

## 2015-01-04 DIAGNOSIS — S069X3S Unspecified intracranial injury with loss of consciousness of 1 hour to 5 hours 59 minutes, sequela: Secondary | ICD-10-CM

## 2015-01-04 DIAGNOSIS — S37091S Other injury of right kidney, sequela: Secondary | ICD-10-CM

## 2015-01-04 NOTE — Discharge Summary (Signed)
Discharge summary job # [id number]
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
## 2015-01-04 NOTE — Progress Notes (Signed)
Occupational Therapy Session Note  Patient Details  Name: Jose Bentley MRN: [id number] Date of Birth: Apr 28, 1996  Today's Date: 01/04/2015 OT Individual Time: 4098-1191 OT Individual Time Calculation (min): 60 min    Short Term Goals: Week 1:  OT Short Term Goal 1 (Week 1): STGs=LTGS secondary to estimated short LOS  Skilled Therapeutic Interventions/Progress Updates:    Pt engaged in BADL retraining including self feeding, toilet transfers, toileting, bathing and dressing with sit<>stand at sink.  Pt directs care/assistance required appropriately and adheres to all weight bearing precautions.  Pt currently requires max A for all BADLs.  Focus on activity tolerance, sit<>stand, standing balance, functional tranfsers, BADLs, and safety precautions to increased independence with BADLs and decrease burden of care.  Therapy Documentation Precautions:  Precautions Precautions: Fall Restrictions Weight Bearing Restrictions: Yes RUE Weight Bearing: Non weight bearing LUE Weight Bearing: Weight bear through elbow only RLE Weight Bearing: Non weight bearing LLE Weight Bearing: Weight bearing as tolerated Pain: Pain Assessment Pain Assessment: 0-10 Pain Score: 6  Pain Type: Acute pain Pain Location: Back Pain Orientation: Mid Pain Descriptors / Indicators: Aching;Grimacing Pain Frequency: Constant Pain Onset: On-going Pain Intervention(s): Meds admin during session ADL:   Exercises:   Other Treatments:    Function:   Eating Eating   Eating Assist Level: Set up assist for   Eating Set Up Assist For: Opening containers       Grooming Oral Care,Brush Teeth, Clean Dentures Activity:      Assist Level: Set up   Set up : To apply toothpaste  Wash, Rinse, Dry Face Activity   Assist Level: Set up   Set up : To obtain items  Wash, Rinse, Dry Hands Activity          Brush, Comb Hair Activity Brush,comb hair activity did not occur: N/A      Shave Activity Shave  activity did not occur: Patient does not shave        Apply Makeup Activity Apply makeup activity did not occur: Patient does not wear makeup                                                           Bathing Bathing position      Bathing parts Body parts bathed by patient: Chest;Abdomen;Front perineal area;Right upper leg;Left upper leg;Right arm Body parts bathed by helper: Left arm;Buttocks;Right lower leg;Left lower leg;Back  Bathing assist         Upper Body Dressing/Undressing Upper body dressing   What is the patient wearing?: Pull over shirt/dress     Pull over shirt/dress - Perfomed by patient: Thread/unthread right sleeve;Thread/unthread left sleeve;Put head through opening;Pull shirt over trunk          Upper body assist Assist Level: Supervision or verbal cues       Lower Body Dressing/Undressing Lower body dressing   What is the patient wearing?: Underwear;Pants;Socks;Shoes Underwear - Performed by patient: Thread/unthread right underwear leg;Thread/unthread left underwear leg Underwear - Performed by helper: Pull underwear up/down Pants- Performed by patient: Thread/unthread right pants leg;Thread/unthread left pants leg Pants- Performed by helper: Pull pants up/down       Socks - Performed by helper: Don/doff right sock;Don/doff left sock   Shoes - Performed by helper: Don/doff right shoe;Don/doff left shoe;Fasten right;Fasten left  Lower body assist         Toileting Toileting   Toileting steps completed by patient: Performs perineal hygiene;Adjust clothing after toileting Toileting steps completed by helper: Adjust clothing prior to toileting    Toileting assist       Transfers                       Toilet transfer   Toilet transfer assistive device: Elevated toilet seat/BSC over toilet       Assist level to toilet: Touching or steadying assistance (Pt > 75%) Assist level from toilet: Touching or steadying  assistance (Pt > 75%)  Tub/shower transfer Tub/shower transfer activity did not occur: Simulated Tub/shower assistive device: Tub transfer bench   Assist level into tub: Maximal assist (Pt 25 - 49%/lift and lower) Assist level out of tub: Maximal assist (Pt 25 - 49%/lift and lower)   Cognition Comprehension Comprehension assist level: Follows complex conversation/direction with extra time/assistive device  Expression Expression assist level: Expresses basic needs/ideas: With no assist  Social Interaction Social Interaction assist level: Interacts appropriately with others with medication or extra time (anti-anxiety, antidepressant).  Problem Solving Problem solving assist level: Solves basic problems with no assist  Memory Memory assist level: Recognizes or recalls 90% of the time/requires cueing < 10% of the time    Therapy/Group: Individual Therapy  Rich Brave 01/04/2015, 9:34 AM

## 2015-01-04 NOTE — Progress Notes (Signed)
Physical Therapy Discharge Summary  Patient Details  Name: Jose Bentley MRN: [id number] Date of Birth: May 07, 1995  Today's Date: 01/05/2015  Patient has met 3 of 3 long term goals due to improved activity tolerance, improved balance, increased strength, decreased pain, ability to compensate for deficits, functional use of  left upper extremity and left lower extremity and improved awareness.  Patient to discharge at a wheelchair level of min- Mod Assist for stand pivot transfers but total A for w/c mobility.   Patient's care partner is independent to provide the necessary physical assistance at discharge.  Reasons goals not met: All goals met  Recommendation:  Patient will benefit from ongoing skilled PT services in home health setting to continue to advance safe functional mobility, address ongoing impairments in UE and LE weakness, impaired activity tolerance, balance, gait (when restrictions lifted), and minimize fall risk.  Equipment: manual w/c  Reasons for discharge: treatment goals met and discharge from hospital  Patient/family agrees with progress made and goals achieved: Yes  PT Discharge Precautions/Restrictions  NWB RUE and RLE; WB through L wrist only; WBAT LLE Pain  No c/o pain at rest Vision/Perception   Still c/o diplopia, impaired focus of vision Sensation Sensation Light Touch: Appears Intact Proprioception: Appears Intact Coordination Gross Motor Movements are Fluid and Coordinated: Yes Motor  Motor Motor: Within Functional Limits  Balance Balance Balance Assessed: Yes Static Sitting Balance Static Sitting - Level of Assistance: 7: Independent Dynamic Sitting Balance Dynamic Sitting - Level of Assistance: 6: Modified independent (Device/Increase time) Static Standing Balance Static Standing - Balance Support: Left upper extremity supported Static Standing - Level of Assistance: 4: Min assist Dynamic Standing Balance Dynamic Standing - Balance  Support: Left upper extremity supported Dynamic Standing - Level of Assistance: 4: Min assist Extremity Assessment      RLE Strength RLE Overall Strength: Deficits;Due to pain;Due to precautions LLE Strength LLE Overall Strength: Due to pain;Deficits  Function:  Bed Mobility Roll left and right activity   Assist level: Supervision or verbal cues  Sit to lying activity   Assist level: Supervision or verbal cues  Lying to sitting activity   Assist level: Supervision or verbal cues  Mobility details Bed mobility details: Visual cues/gestures for sequencing;Verbal cues for techniques;Visual cues/gestures for precautions/safety;Verbal cues for sequencing;Verbal cues for precautions/safety   Transfers Sit to stand transfer   Sit to stand assist level: Moderate assist (Pt 50 - 74%/lift 2 legs/lift or lower) Sit to stand assistive device: Other (staff assist under elbow)  Chair/bed transfer   Chair/bed transfer method: Stand pivot Chair/bed transfer assist level: Touching or steadying assistance (Pt > 75%) Chair/bed transfer assistive device: Other (staff assist under elbow)   Chair/bed transfer details: Manual facilitation for weight shifting;Verbal cues for precautions/safety   Toilet transfer   Toilet transfer assistive device: Elevated toilet seat/BSC over toilet    Car transfer   Car transfer assistive device: Other (assist under elbow) Car transfer assist level: Moderate assist (Pt 50 - 74%/lift or lower, lift 2 legs)    Locomotion Ambulation Ambulation activity did not occur: Safety/medical concerns        Walk 10 feet activity Walk 10 feet activity did not occur: Safety/medical concerns    Walk 50 feet with 2 turns activity Walk 50 feet with 2 turns activity did not occur: Safety/medical concerns    Walk 150 feet activity Walk 150 feet activity did not occur: Safety/medical concerns    Walk 10 feet on uneven surfaces activity Walk  10 feet on uneven surfaces activity  did not occur: Safety/medical concerns    Stairs   Stairs assistive device: Other (comment) (wheelchair) Max number of stairs: 1 Stairs assist level: Dependent (Pt equals 0%)  Walk up/down 1 step activity     Walk up/down 1 step (curb) assist level: Dependent (Pt equals 0%)  Walk up/down 4 steps activity Walk up/down 4 steps activity did not occur: Safety/medical concerns    Walk up/down 12 steps activity Walk up/down 12 steps activity did not occur: Safety/medical concerns    Pick up small objects from floor Pick up small object from the floor (from standing position) activity did not occur: Safety/medical concerns    Wheelchair Wheelchair activity did not occur: Safety/medical concerns (pt unable to self propel due to restrictions) Type: Manual Max wheelchair distance: 150 Assist Level: Dependent (Pt equals 0%)  Wheel 50 feet with 2 turns activity   Assist Level: Dependent (Pt equals 0%)  Wheel 150 feet activity   Assist Level: Dependent (Pt equals 0%)   Cognition Comprehension Comprehension assist level: Follows complex conversation/direction with extra time/assistive device  Expression Expression assist level: Expresses complex ideas: With extra time/assistive device  Social Interaction Social Interaction assist level: Interacts appropriately with others - No medications needed.  Problem Solving Problem solving assist level: Solves complex 90% of the time/cues < 10% of the time  Memory Memory assist level: Requires cues to use assistive device    [name] 01/04/2015, 12:09 PM

## 2015-01-04 NOTE — Progress Notes (Signed)
Occupational Therapy Session Note  Patient Details  Name: Jose Bentley MRN: [id number] Date of Birth: January 21, 1996  Today's Date: 01/04/2015 OT Individual Time: 1400-1430 OT Individual Time Calculation (min): 30 min   Short Term Goals: Week 1:  OT Short Term Goal 1 (Week 1): STGs=LTGS secondary to estimated short LOS  Skilled Therapeutic Interventions/Progress Updates:  Therapeutic activity with emphasis on pt ed on status of his injuries  using review of imaging.   Pt able to view image of femur fracture and scapula fracture while standing supported for 5 min.   Pt completes sit<>stand with mod assist but lowers self with only supervision for safety to maintain NWB on right side.   Pt requires steadying assist while standing supported but is able to maintain static standing position for 15 seconds before fatigue with need to recover in w/c.        Therapy Documentation Precautions:  Precautions Precautions: Fall Restrictions Weight Bearing Restrictions: Yes RUE Weight Bearing: Non weight bearing LUE Weight Bearing: Weight bear through elbow only RLE Weight Bearing: Non weight bearing LLE Weight Bearing: Weight bearing as tolerated  Pain: Pain Assessment Pain Assessment: Faces Faces Pain Scale: Hurts even more Pain Type: Acute pain Pain Location: Leg Pain Orientation: Right Pain Descriptors / Indicators: Aching;Grimacing Pain Onset: With Activity Pain Intervention(s): Repositioned;Rest  Therapy/Group: Individual Therapy  Cheyrl Buley 01/04/2015, 3:13 PM

## 2015-01-04 NOTE — Progress Notes (Signed)
Patient information reviewed and entered into [facility] system by Lakelyn Straus, RN, CRRN, PPS Coordinator.  Information including medical coding and functional independence measure will be reviewed and updated through discharge.    

## 2015-01-04 NOTE — Progress Notes (Signed)
Speech Language Pathology Discharge Summary  Patient Details  Name: Jose Bentley MRN: [id number] Date of Birth: Aug 24, 1995  Today's Date: 01/04/2015 SLP Individual Time: 0930-1030 SLP Individual Time Calculation (min): 60 min   Skilled Therapeutic Interventions:  Skilled treatment session focused on addressing goals and education for discharge.  SLP facilitated session by educating patient on prognosis of recovery of cognitive deficits including attention, recall and memory which impact his overall independence with complex problem solving. Patient participated in a discussion regarding anticipated home management, modifications, and assist.  He required Supervision level question cues to plan appropriately. SLP also educated patient in recommendations for 24/7 supervision upon discharge to facilitate recall of and consistent use in the moment of weight bearing restrictions; patient in agreement that he can recall them but needs cues to monitor and correct in the moment.  Patient also in agreement with outpatient SLP follow up and is aware recommendations to contact disability support services on campus.  Education complete and patient will have level of assist needed upon discharge.     Patient has met  (0) of  (4) long term goals.  Patient to discharge at overall Supervision level.  Reasons goals not met: due to shorter length of stay then estimated upon eval overall supervision instead of mod I    Clinical Impression/Discharge Summary: Patient has made functional gains during this rehab admission despite not meeting his anticipated goals at the Mod I level.  Given ongoing weightbearing restrictions and impact of medications patient continues to require Supervision level assist for cognitive tasks.  Patient and family education has been completed and patient will discharge home with 24 hour supervision.  Patient would benefit from follow up SLP services to continue efforts to maximize independence  with complex skills and to maximize his functional independence to further reduce the burden of care.   Care Partner:  Caregiver Able to Provide Assistance: Yes  Type of Caregiver Assistance: Physical;Cognitive  Recommendation:  24 hour supervision/assistance;Outpatient SLP  Rationale for SLP Follow Up: Maximize cognitive function and independence;Reduce caregiver burden   Equipment: none   Reasons for discharge: Discharged from hospital   Patient/Family Agrees with Progress Made and Goals Achieved: Yes   Function:  Cognition Comprehension Comprehension assist level: Understands complex 90% of the time/cues 10% of the time  Expression   Expression assist level: Expresses complex ideas: With extra time/assistive device  Social Interaction Social Interaction assist level: Interacts appropriately with others - No medications needed.  Problem Solving Problem solving assist level: Solves complex 90% of the time/cues < 10% of the time  Memory Memory assist level: Recognizes or recalls 90% of the time/requires cueing < 10% of the time   Carmelia Roller., CCC-SLP [facility] 01/05/2015, 7:59 PM

## 2015-01-04 NOTE — Progress Notes (Signed)
Physical Therapy Session Note  Patient Details  Name: Jose Bentley MRN: [id number] Date of Birth: Nov 01, 1995  Today's Date: 01/04/2015 PT Individual Time: 1300-1400 PT Individual Time Calculation (min): 60 min   Short Term Goals: Week 1:  PT Short Term Goal 1 (Week 1): STG=LTG  Skilled Therapeutic Interventions/Progress Updates:  Session focused on functional transfers and standing tolerance. Patient participated in game of Wii Bowling in standing with supervision-min guard, able to tolerate standing between 2-5 min at a time before requiring seated rest break. Performed sit <> stand and stand pivot transfers from wheelchair with patient pushing off from therapist using L elbow with min A overall and mod A x 1 from low booth in cafeteria. Performed [employer] in standing using LUE only with supervision-min guard, mode A = score of 49, 1.22 sec avg reaction time and Memory Test 1 with decreased ability to read numbers off T-scope while hitting buttons = score of 27, 0.79 sec avg reaction time, with seated rest break between trials. Patient left sitting in wheelchair with all needs within reach to await OT session.   Therapy Documentation Precautions:  Precautions Precautions: Fall Restrictions Weight Bearing Restrictions: Yes RUE Weight Bearing: Non weight bearing LUE Weight Bearing: Weight bear through elbow only RLE Weight Bearing: Non weight bearing LLE Weight Bearing: Weight bearing as tolerated Pain: Pain Assessment Pain Assessment: Faces Faces Pain Scale: Hurts even more Pain Type: Acute pain Pain Location: Leg Pain Orientation: Right Pain Descriptors / Indicators: Aching;Grimacing Pain Onset: With Activity Pain Intervention(s): Repositioned;Rest  Function:   Transfers Sit to stand transfer   Sit to stand assist level: Moderate assist (Pt 50 - 74%/lift 2 legs/lift or lower) Sit to stand assistive device: Other (assist at L elbow)  Chair/bed transfer   Chair/bed  transfer method: Stand pivot Chair/bed transfer assist level: Touching or steadying assistance (Pt > 75%) Chair/bed transfer assistive device: Other (assist at L elbow)   Chair/bed transfer details: Verbal cues for precautions/safety   Toilet transfer                     Locomotion Ambulation   Walk 10 feet activity   Walk 50 feet with 2 turns activity   Walk 150 feet activity   Walk 10 feet on uneven surfaces activity   Stairs   Walk up/down 1 step activity   Walk up/down 4 steps activity   Walk up/down 12 steps activity   Pick up small objects from floor   Wheelchair Wheelchair activity did not occur: Safety/medical concerns (pt unable to self propel due to restrictions) Type: Manual Max wheelchair distance: 150 Assist Level: Dependent (Pt equals 0%)  Wheel 50 feet with 2 turns activity   Assist Level: Dependent (Pt equals 0%)  Wheel 150 feet activity   Assist Level: Dependent (Pt equals 0%)   Cognition Comprehension Comprehension assist level: Follows basic conversation/direction with extra time/assistive device  Expression Expression assist level: Expresses basic needs/ideas: With no assist  Social Interaction Social Interaction assist level: Interacts appropriately with others with medication or extra time (anti-anxiety, antidepressant).  Problem Solving Problem solving assist level: Solves basic problems with no assist  Memory Memory assist level: Recognizes or recalls 90% of the time/requires cueing < 10% of the time    Therapy/Group: Individual Therapy  Kerney Elbe 01/04/2015, 2:03 PM

## 2015-01-05 ENCOUNTER — Inpatient Hospital Stay (HOSPITAL_COMMUNITY): Payer: BLUE CROSS/BLUE SHIELD | Admitting: Physical Therapy

## 2015-01-05 MED ORDER — DOCUSATE SODIUM 100 MG PO CAPS: 200.0000 mg | ORAL_CAPSULE | Freq: Two times a day (BID) | ORAL | Status: AC

## 2015-01-05 MED ORDER — OXYCODONE HCL 5 MG PO TABS: 20.0000 mg | ORAL_TABLET | Freq: Once | ORAL | Status: DC

## 2015-01-05 MED ORDER — OXYCODONE HCL 10 MG PO TABS: 10.0000 mg | ORAL_TABLET | ORAL | Status: AC | PRN

## 2015-01-05 MED ORDER — OXYCODONE HCL ER 10 MG PO T12A: 10.0000 mg | EXTENDED_RELEASE_TABLET | Freq: Two times a day (BID) | ORAL | Status: AC

## 2015-01-05 NOTE — Progress Notes (Signed)
Social Work  Discharge Note  The overall goal for the admission was met for:   Discharge location: Yes - home with aunt/ uncle who can provide 24/7 assistance  Length of Stay: Yes - 5 days  Discharge activity level: Yes - moderate assistance overall  Home/community participation: Yes  Services provided included: MD, RD, PT, OT, SLP, RN, Pharmacy and [name]: Private Insurance: [employer]  Follow-up services arranged: Home Health: RN, PT via [facility], DME: [phone] lightweight w/c, cushion, 3n1 commode and tub bench via [facility] and Patient/Family has no preference for HH/DME agencies  Comments (or additional information):  Patient/Family verbalized understanding of follow-up arrangements: Yes  Individual responsible for coordination of the follow-up plan: pt  Confirmed correct DME delivered: Jose Bentley 01/05/2015    Jose Bentley

## 2015-01-05 NOTE — Progress Notes (Signed)
Physical Therapy Session Note  Patient Details  Name: Drewey Begue MRN: [id number] Date of Birth: [birth year]   Today's Date: 01/04/2015  PT Individual Time: 1100-1200 PT Individual Time Calculation (min): 60 min   Short Term Goals: Week 1:  PT Short Term Goal 1 (Week 1): STG=LTG  Skilled Therapeutic Interventions/Progress Updates:  Pt participated in skilled PT with focus on transfers to various types/heights of furniture inside and outside, pivoting on carpet and brick all with min-mod A overall; during rest breaks engaged pt in discussing home set up and problem solving returning to class and hobbies. Performed transfer to flat mat and sit <> supine with wedge to simulate bed at uncle's home.  Uncle to obtain measurements of doorways and will adjust w/c as needed for access (removal of rims).  Pt left in w/c with lunch set up and all items within reach.    Therapy Documentation Precautions:  Precautions Precautions: Fall Restrictions Weight Bearing Restrictions: Yes RUE Weight Bearing: Non weight bearing LUE Weight Bearing: Weight bear through elbow only RLE Weight Bearing: Non weight bearing LLE Weight Bearing: Weight bearing as tolerated Vital Signs: Therapy Vitals Temp: 98.7 F (37.1 C) Temp Source: Oral Pulse Rate: 81 Resp: 18 BP: 115/67 mmHg Patient Position (if appropriate): Lying Oxygen Therapy SpO2: 98 % O2 Device: Not Delivered  Function:  Bed Mobility Roll left and right activity   Assist level: Supervision or verbal cues  Sit to lying activity   Assist level: Supervision or verbal cues  Lying to sitting activity   Assist level: Supervision or verbal cues  Mobility details Bed mobility details: Visual cues/gestures for sequencing;Verbal cues for techniques;Visual cues/gestures for precautions/safety;Verbal cues for sequencing;Verbal cues for precautions/safety   Transfers Sit to stand transfer   Sit to stand assist level: Moderate assist (Pt 50 -  74%/lift 2 legs/lift or lower) Sit to stand assistive device: Other (staff assist under elbow)  Chair/bed transfer   Chair/bed transfer method: Stand pivot Chair/bed transfer assist level: Touching or steadying assistance (Pt > 75%) Chair/bed transfer assistive device: Other (staff assist under elbow)   Chair/bed transfer details: Manual facilitation for weight shifting;Verbal cues for precautions/safety   Toilet transfer   Toilet transfer assistive device: Elevated toilet seat/BSC over toilet    Car transfer   Car transfer assistive device: Other (assist under elbow) Car transfer assist level: Moderate assist (Pt 50 - 74%/lift or lower, lift 2 legs)    Locomotion Ambulation Ambulation activity did not occur: Safety/medical concerns        Walk 10 feet activity Walk 10 feet activity did not occur: Safety/medical concerns    Walk 50 feet with 2 turns activity Walk 50 feet with 2 turns activity did not occur: Safety/medical concerns    Walk 150 feet activity Walk 150 feet activity did not occur: Safety/medical concerns    Walk 10 feet on uneven surfaces activity Walk 10 feet on uneven surfaces activity did not occur: Safety/medical concerns    Stairs   Stairs assistive device: Other (comment) (wheelchair) Max number of stairs: 1 Stairs assist level: Dependent (Pt equals 0%)  Walk up/down 1 step activity     Walk up/down 1 step (curb) assist level: Dependent (Pt equals 0%)  Walk up/down 4 steps activity Walk up/down 4 steps activity did not occur: Safety/medical concerns    Walk up/down 12 steps activity Walk up/down 12 steps activity did not occur: Safety/medical concerns    Pick up small objects from floor Pick up small object  from the floor (from standing position) activity did not occur: Safety/medical concerns    Wheelchair Wheelchair activity did not occur: Safety/medical concerns (pt unable to self propel due to restrictions) Type: Manual Max wheelchair distance:  150 Assist Level: Dependent (Pt equals 0%)  Wheel 50 feet with 2 turns activity   Assist Level: Dependent (Pt equals 0%)  Wheel 150 feet activity   Assist Level: Dependent (Pt equals 0%)   Cognition Comprehension Comprehension assist level: Follows complex conversation/direction with extra time/assistive device  Expression Expression assist level: Expresses complex ideas: With extra time/assistive device  Social Interaction Social Interaction assist level: Interacts appropriately with others - No medications needed.  Problem Solving Problem solving assist level: Solves complex 90% of the time/cues < 10% of the time  Memory Memory assist level: Requires cues to use assistive device     Therapy/Group: Individual Therapy  [facility] [address] [facility] 01/05/2015, 8:23 AM (written and back dated due to pt to D/C after therapies on 01/05/15)

## 2015-01-05 NOTE — Progress Notes (Signed)
Physical Therapy Session Note  Patient Details  Name: Jose Bentley MRN: [id number] Date of Birth: Jan 07, 1996  Today's Date: 01/05/2015 PT Individual Time: 0905-1000 PT Individual Time Calculation (min): 55 min   Short Term Goals: Week 1:  PT Short Term Goal 1 (Week 1): STG=LTG  Skilled Therapeutic Interventions/Progress Updates:   Session focused on functional transfers, standing tolerance and balance with reaching using LUE, and dual tasking. Performed multiple sit <> stands from wheelchair with min A at elbow progressed to close supervision. Performed [employer] in standing at supervision level with improvement in all areas compared to yesterday's testing: Mode A = score of 67, 0.9 sec avg reaction time, Memory Test 1 = score of 93/95, 0.62 sec avg reaction time, Grocery Test = 38/38, one error, 0.76 sec avg reaction time. Reviewed discharge planning and patient reported doorways measuring 27" which will provided adequate room for 16x16 wheelchair. Patient left sitting in wheelchair with all needs within reach.    Therapy Documentation Precautions:  Precautions Precautions: Fall Restrictions Weight Bearing Restrictions: Yes RUE Weight Bearing: Non weight bearing LUE Weight Bearing: Weight bear through elbow only RLE Weight Bearing: Non weight bearing LLE Weight Bearing: Weight bearing as tolerated Pain:  Unrated neck pain due to shoulder sling  See Function Navigator for Current Functional Status.  Therapy/Group: Individual Therapy  Kerney Elbe 01/05/2015, 9:58 AM
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
## 2015-01-05 NOTE — Progress Notes (Signed)
Occupational Therapy Discharge Summary  Patient Details  Name: Jose Bentley MRN: [id number] Date of Birth: 1995-09-11    Patient has met 6 of 6 long term goals due to improved activity tolerance, ability to compensate for deficits, improved attention, improved awareness and improved coordination.  Pt made steady progress during this admission.  Family members have been present and participated in therapy.  Pt is max A for BADLs and is independent with directing care.  Pt continues to exhibit diplopia and has been issued an eye patch with instructions on use.  Pt has verbalized understanding. Patient to discharge at [facility] Max Assist level.  Patient's care partner is independent to provide the necessary physical and cognitive assistance at discharge.    Recommendation:  Patient will benefit from ongoing skilled OT services in home health setting to continue to advance functional skills in the area of BADL and iADL.  Equipment: tub transfer bench and BSC  Reasons for discharge: treatment goals met and discharge from hospital  Patient/family agrees with progress made and goals achieved: Yes  OT Discharge   Vision/Perception  Vision- History Baseline Vision/History: No visual deficits Patient Visual Report: Blurring of vision;Diplopia Vision- Assessment Vision Assessment?: Yes Eye Alignment: Impaired (comment) Tracking/Visual Pursuits: Decreased smoothness of horizontal tracking;Decreased smoothness of vertical tracking  Cognition Overall Cognitive Status: Impaired/Different from baseline Arousal/Alertness: Awake/alert Orientation Level: Oriented X4 Attention: Alternating;Selective Focused Attention: Appears intact Selective Attention: Impaired Alternating Attention: Impaired Memory: Impaired Memory Impairment: Decreased short term memory;Decreased recall of new information Awareness: Impaired Awareness Impairment: Anticipatory impairment;Emergent impairment Problem Solving:  Impaired Problem Solving Impairment: Functional complex Safety/Judgment: Impaired [employer] Scales of Cognitive Functioning: Purposeful/appropriate Sensation Sensation Light Touch: Appears Intact Stereognosis: Not tested Hot/Cold: Appears Intact Proprioception: Appears Intact Motor  Motor Motor: Within Functional Limits    Trunk/Postural Assessment  Cervical Assessment Cervical Assessment: Within Functional Limits Thoracic Assessment Thoracic Assessment: Within Functional Limits Lumbar Assessment Lumbar Assessment: Within Functional Limits Postural Control Postural Control: Within Functional Limits  Balance Static Sitting Balance Static Sitting - Balance Support: Feet supported Static Sitting - Level of Assistance: 7: Independent Dynamic Sitting Balance Dynamic Sitting - Level of Assistance: 6: Modified independent (Device/Increase time) Extremity/Trunk Assessment RUE Assessment RUE Assessment: Not tested (not test secondary to fx and weight bearing restrictions) LUE Assessment LUE Assessment: Not tested (secondary to weight bearing restrictions and fx)   See Function Navigator for Current Functional Status.  Leotis Shames [facility] 01/05/2015, 3:22 PM
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
## 2015-01-05 NOTE — Plan of Care (Signed)
Problem: RH PAIN MANAGEMENT Goal: RH STG PAIN MANAGED AT OR BELOW PT'S PAIN GOAL Outcome: Completed/Met Date Met:  01/05/15 <3
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
## 2015-01-05 NOTE — Discharge Instructions (Signed)
Inpatient Rehab Discharge Instructions  Jose Thomas [name] and time: No discharge date for patient encounter.   Activities/Precautions/ Functional Status: Activity: Nonweightbearing right upper extremity, nonweightbearing right lower extremity, weightbearing as tolerated through left upper extremity elbow only Diet: regular diet Wound Care: keep wound clean and dry Functional status:  ___ No restrictions     ___ Walk up steps independently ___ 24/7 supervision/assistance   ___ Walk up steps with assistance ___ Intermittent supervision/assistance  ___ Bathe/dress independently ___ Walk with walker     ___ Bathe/dress with assistance _x__ Walk Independently    ___ Shower independently ___ Walk with assistance    ___ Shower with assistance ___ No alcohol     ___ Return to work/school ________    COMMUNITY REFERRALS UPON DISCHARGE:    Home Health:   PT       RN                      Agency:  [facility] Phone: [phone]   Medical Equipment/Items Ordered:  Wheelchair, commode and tub bench                                                      Agency/Supplier: [facility] @ [phone]       Special Instructions: Continue URINOMA drain per interventional radiology and follow-up in the clinic Dr. Archer Asa of interventional radiology [phone]   My questions have been answered and I understand these instructions. I will adhere to these goals and the provided educational materials after my discharge from the hospital.  Patient/Caregiver Signature _______________________________ Date __________  Clinician Signature _______________________________________ Date __________  Please bring this form and your medication list with you to all your follow-up doctor's appointments.   For UROLOGY/Dr. Retta Diones:  Empty drain every 12 hourds(or more often if output greater) Keep record of drain amount/bring record to Dr Dahlstedt's office Notify Dr Retta Diones of  increasing flank/abdominal pain or fever   Xeroform dressing with 4 x 4 to left knee change daily

## 2015-01-06 ENCOUNTER — Other Ambulatory Visit (HOSPITAL_COMMUNITY): Payer: Self-pay | Admitting: Interventional Radiology

## 2015-01-06 ENCOUNTER — Other Ambulatory Visit: Payer: Self-pay | Admitting: Urology

## 2015-01-06 DIAGNOSIS — S37001A Unspecified injury of right kidney, initial encounter: Secondary | ICD-10-CM

## 2015-01-06 DIAGNOSIS — S37091D Other injury of right kidney, subsequent encounter: Secondary | ICD-10-CM

## 2015-01-06 DIAGNOSIS — S37001S Unspecified injury of right kidney, sequela: Secondary | ICD-10-CM

## 2015-01-06 DIAGNOSIS — IMO0002 Reserved for concepts with insufficient information to code with codable children: Secondary | ICD-10-CM

## 2015-01-12 ENCOUNTER — Ambulatory Visit
Admission: RE | Admit: 2015-01-12 | Discharge: 2015-01-12 | Disposition: A | Discharge status: Home / Self Care | Payer: BLUE CROSS/BLUE SHIELD | Source: Ambulatory Visit | Attending: Interventional Radiology | Admitting: Interventional Radiology

## 2015-01-12 ENCOUNTER — Ambulatory Visit
Admission: RE | Admit: 2015-01-12 | Discharge: 2015-01-12 | Disposition: A | Discharge status: Home / Self Care | Payer: BLUE CROSS/BLUE SHIELD | Source: Ambulatory Visit | Attending: Urology | Admitting: Urology

## 2015-01-12 ENCOUNTER — Other Ambulatory Visit: Payer: BLUE CROSS/BLUE SHIELD

## 2015-01-12 DIAGNOSIS — S37091D Other injury of right kidney, subsequent encounter: Secondary | ICD-10-CM

## 2015-01-12 DIAGNOSIS — S37001A Unspecified injury of right kidney, initial encounter: Secondary | ICD-10-CM

## 2015-01-12 DIAGNOSIS — IMO0002 Reserved for concepts with insufficient information to code with codable children: Secondary | ICD-10-CM

## 2015-01-12 DIAGNOSIS — S37001S Unspecified injury of right kidney, sequela: Secondary | ICD-10-CM

## 2015-01-12 MED ORDER — IOPAMIDOL (ISOVUE-300) INJECTION 61%
100.0000 mL | Freq: Once | INTRAVENOUS | Status: AC | PRN
  Administered 2015-01-12: 100 mL via INTRAVENOUS
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
## 2015-01-18 DIAGNOSIS — S32019D Unspecified fracture of first lumbar vertebra, subsequent encounter for fracture with routine healing: Secondary | ICD-10-CM | POA: Diagnosis not present

## 2015-01-18 DIAGNOSIS — S069X0D Unspecified intracranial injury without loss of consciousness, subsequent encounter: Secondary | ICD-10-CM | POA: Diagnosis not present

## 2015-01-18 DIAGNOSIS — S42101D Fracture of unspecified part of scapula, right shoulder, subsequent encounter for fracture with routine healing: Secondary | ICD-10-CM | POA: Diagnosis not present

## 2015-01-18 DIAGNOSIS — S32029D Unspecified fracture of second lumbar vertebra, subsequent encounter for fracture with routine healing: Secondary | ICD-10-CM | POA: Diagnosis not present

## 2015-01-26 ENCOUNTER — Other Ambulatory Visit: Payer: Self-pay | Admitting: Urology

## 2015-01-26 DIAGNOSIS — S37009D Unspecified injury of unspecified kidney, subsequent encounter: Secondary | ICD-10-CM

## 2015-01-31 ENCOUNTER — Other Ambulatory Visit (HOSPITAL_COMMUNITY): Payer: Self-pay | Admitting: Interventional Radiology

## 2015-01-31 ENCOUNTER — Ambulatory Visit
Admission: RE | Admit: 2015-01-31 | Discharge: 2015-01-31 | Disposition: A | Discharge status: Home / Self Care | Payer: BLUE CROSS/BLUE SHIELD | Source: Ambulatory Visit | Attending: Urology | Admitting: Urology

## 2015-01-31 DIAGNOSIS — S37001S Unspecified injury of right kidney, sequela: Secondary | ICD-10-CM

## 2015-01-31 DIAGNOSIS — S37001A Unspecified injury of right kidney, initial encounter: Secondary | ICD-10-CM

## 2015-01-31 DIAGNOSIS — S37009D Unspecified injury of unspecified kidney, subsequent encounter: Secondary | ICD-10-CM

## 2015-01-31 DIAGNOSIS — IMO0002 Reserved for concepts with insufficient information to code with codable children: Secondary | ICD-10-CM

## 2015-01-31 DIAGNOSIS — S37091D Other injury of right kidney, subsequent encounter: Secondary | ICD-10-CM

## 2015-01-31 MED ORDER — IOPAMIDOL (ISOVUE-300) INJECTION 61%
100.0000 mL | Freq: Once | INTRAVENOUS | Status: AC | PRN
  Administered 2015-01-31: 100 mL via INTRAVENOUS

## 2015-02-01 ENCOUNTER — Ambulatory Visit
Admission: RE | Admit: 2015-02-01 | Discharge: 2015-02-01 | Disposition: A | Discharge status: Home / Self Care | Payer: BLUE CROSS/BLUE SHIELD | Source: Ambulatory Visit | Attending: Interventional Radiology | Admitting: Interventional Radiology

## 2015-02-01 ENCOUNTER — Other Ambulatory Visit: Payer: Self-pay | Admitting: Interventional Radiology

## 2015-02-01 ENCOUNTER — Other Ambulatory Visit (HOSPITAL_COMMUNITY): Payer: Self-pay | Admitting: Interventional Radiology

## 2015-02-01 DIAGNOSIS — S37091D Other injury of right kidney, subsequent encounter: Secondary | ICD-10-CM

## 2015-02-01 DIAGNOSIS — IMO0002 Reserved for concepts with insufficient information to code with codable children: Secondary | ICD-10-CM

## 2015-02-01 DIAGNOSIS — S37001S Unspecified injury of right kidney, sequela: Secondary | ICD-10-CM

## 2015-02-01 DIAGNOSIS — S37001A Unspecified injury of right kidney, initial encounter: Secondary | ICD-10-CM
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
## 2015-02-03 ENCOUNTER — Telehealth: Payer: Self-pay | Admitting: Radiology

## 2015-02-03 NOTE — Telephone Encounter (Signed)
Patient phoned with concern of hematuria & ? Drainage catheter displacement.  Patient has an appointment at 12:45 pm today to see Dr. Retta Diones.  Patient instructed to check w/ Dr Retta Diones during his appointment re:  Possible tube check at [facility] (per Jeananne Rama, PA-C.  Dalyla Chui [place], RN 02/03/2015 11:24 AM

## 2015-02-14 ENCOUNTER — Encounter: Payer: BLUE CROSS/BLUE SHIELD | Admitting: Physical Medicine & Rehabilitation

## 2015-02-15 ENCOUNTER — Ambulatory Visit
Admission: RE | Admit: 2015-02-15 | Discharge: 2015-02-15 | Disposition: A | Discharge status: Home / Self Care | Payer: BLUE CROSS/BLUE SHIELD | Source: Ambulatory Visit | Attending: Interventional Radiology | Admitting: Interventional Radiology

## 2015-02-15 DIAGNOSIS — IMO0002 Reserved for concepts with insufficient information to code with codable children: Secondary | ICD-10-CM

## 2015-02-15 DIAGNOSIS — S37001S Unspecified injury of right kidney, sequela: Secondary | ICD-10-CM

## 2015-02-15 DIAGNOSIS — S37091D Other injury of right kidney, subsequent encounter: Secondary | ICD-10-CM

## 2015-02-15 DIAGNOSIS — S37001A Unspecified injury of right kidney, initial encounter: Secondary | ICD-10-CM

## 2015-02-15 NOTE — Progress Notes (Signed)
Referring Physician(s): Dr. Retta Dionesahlstedt   Subjective: Mr. Jose Bentley is a 19 yo male here for follow up of a right perinephric abscess drain. This originated from an injury to his right kidney that created a fistulous communication to the infected urinoma/abscess. He has done well, last drain injection on 10/4 showed resolution of the urinoma, but the fistulous tract was still patent.  He saw Dr. Retta Dionesahlstedt after having some hematuria and he retracted the drain about 2cm and re-secured. He reports very minimal, turbid output over the past week or so.  Denies fevers, chills, pain, nausea. He returns today for repeat drain injection and hopefully removal of his drain. No changes to PMHx, meds.  Allergies: Review of patient's allergies indicates no known allergies.  Medications: Prior to Admission medications   Medication Sig Start Date End Date Taking? Authorizing Provider  docusate sodium (COLACE) 100 MG capsule Take 2 capsules (200 mg total) by mouth 2 (two) times daily. 01/05/15   Mcarthur Rossettianiel J Angiulli, PA-C  OxyCODONE (OXYCONTIN) 10 mg T12A 12 hr tablet Take 1 tablet (10 mg total) by mouth every 12 (twelve) hours. 01/05/15   Mcarthur Rossettianiel J Angiulli, PA-C  oxyCODONE 10 MG TABS Take 1-2 tablets (10-20 mg total) by mouth every 4 (four) hours as needed (10mg  for mild pain, 15mg  for moderate pain, 20mg  for severe pain). 01/05/15   Mcarthur Rossettianiel J Angiulli, PA-C     Vital Signs: BP 123/63 mmHg  Pulse 87  Temp(Src) 97.8 F (36.6 C) (Oral)  SpO2 99%  Physical Exam  Constitutional: He is oriented to person, place, and time. He appears well-developed and well-nourished. No distress.  Cardiovascular: Normal rate, regular rhythm and normal heart sounds.   Pulmonary/Chest: Effort normal and breath sounds normal.  Abdominal: Soft. Bowel sounds are normal. There is no tenderness.  (R)flank drain intact, site clean. Cloudy beige output in bag, only about 15cc.  Neurological: He is alert and oriented to person, place,  and time.  Psychiatric: He has a normal mood and affect. Judgment normal.    Imaging: See report for today's fluoro injection  Labs:  CBC:  Recent Labs  12/28/14 0419 12/29/14 0447 01/02/15 1608 01/03/15 0445  WBC 9.8 11.3* 11.4* 10.8*  HGB 7.4* 7.6* 9.4* 8.4*  HCT 22.1* 22.8* 28.7* 25.4*  PLT 227 340 722* 655*    COAGS:  Recent Labs  12/22/14 1310 12/23/14 0330 12/24/14 0552 01/02/15 1608  INR 1.23 1.59* 1.33 1.24    BMP:  Recent Labs  12/25/14 0243 12/28/14 0419 01/02/15 1608 01/03/15 0445  NA 133* 135 133* 134*  K 4.0 4.7 4.2 3.7  CL 102 100* 96* 96*  CO2 25 27 29 28   GLUCOSE 112* 115* 111* 105*  BUN 7 11 12 11   CALCIUM 8.0* 8.3* 9.2 8.8*  CREATININE 0.81 0.68 0.68 0.64  GFRNONAA >60 >60 >60 >60  GFRAA >60 >60 >60 >60    LIVER FUNCTION TESTS:  Recent Labs  12/22/14 1310 12/24/14 0552 01/03/15 0445  BILITOT 1.3* 1.4* 3.3*  AST 201* 104* 41  ALT 163* 63 39  ALKPHOS 71 45 70  PROT 7.0 4.9* 6.3*  ALBUMIN 4.1 3.0* 3.1*    Assessment and Plan: 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 D/w Dr. Retta Dionesahlstedt, ok to remove drain. Drain removed without difficulty Instructions given to pt, he already has follow up plans with Dr. Retta Dionesahlstedt.   SignedBrayton El: Alfredo Collymore 02/15/2015, 10:12 AM  I spent a total of 15 Minutes at the the patient's bedside AND on the patient's hospital floor or unit, greater than 50% of which was counseling/coordinating care for perinephric urinoma drain care
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
